# Patient Record
Sex: Male | Born: 1935 | Race: White | Hispanic: No | Marital: Married | Smoking: Former smoker
Health system: Southern US, Community
[De-identification: ages and names within clinical notes are randomized; demographics above are authoritative.]

## PROBLEM LIST (undated history)

## (undated) DIAGNOSIS — J449 Chronic obstructive pulmonary disease, unspecified: Secondary | ICD-10-CM

## (undated) DIAGNOSIS — J8410 Pulmonary fibrosis, unspecified: Secondary | ICD-10-CM

## (undated) DIAGNOSIS — I4891 Unspecified atrial fibrillation: Secondary | ICD-10-CM

## (undated) DIAGNOSIS — M109 Gout, unspecified: Secondary | ICD-10-CM

## (undated) DIAGNOSIS — I509 Heart failure, unspecified: Secondary | ICD-10-CM

## (undated) DIAGNOSIS — I219 Acute myocardial infarction, unspecified: Secondary | ICD-10-CM

## (undated) DIAGNOSIS — I10 Essential (primary) hypertension: Secondary | ICD-10-CM

## (undated) DIAGNOSIS — I272 Pulmonary hypertension, unspecified: Secondary | ICD-10-CM

## (undated) DIAGNOSIS — E785 Hyperlipidemia, unspecified: Secondary | ICD-10-CM

## (undated) DIAGNOSIS — I2510 Atherosclerotic heart disease of native coronary artery without angina pectoris: Secondary | ICD-10-CM

## (undated) DIAGNOSIS — N189 Chronic kidney disease, unspecified: Secondary | ICD-10-CM

## (undated) DIAGNOSIS — C4491 Basal cell carcinoma of skin, unspecified: Secondary | ICD-10-CM

## (undated) HISTORY — PX: REPLACEMENT TOTAL KNEE: SUR1224

## (undated) HISTORY — DX: Gout, unspecified: M10.9

## (undated) HISTORY — DX: Basal cell carcinoma of skin, unspecified: C44.91

## (undated) HISTORY — DX: Atherosclerotic heart disease of native coronary artery without angina pectoris: I25.10

## (undated) HISTORY — DX: Chronic kidney disease, unspecified: N18.9

## (undated) HISTORY — PX: HERNIA REPAIR: SHX51

## (undated) HISTORY — PX: CARDIOVERSION: SHX1299

## (undated) HISTORY — DX: Pulmonary hypertension, unspecified: I27.20

## (undated) HISTORY — DX: Chronic obstructive pulmonary disease, unspecified: J44.9

## (undated) HISTORY — PX: CAROTID ENDARTERECTOMY: SUR193

## (undated) HISTORY — PX: CARDIAC CATHETERIZATION: SHX172

## (undated) HISTORY — DX: Unspecified atrial fibrillation: I48.91

## (undated) HISTORY — PX: TOTAL HIP ARTHROPLASTY: SHX124

## (undated) HISTORY — DX: Acute myocardial infarction, unspecified: I21.9

## (undated) HISTORY — DX: Heart failure, unspecified: I50.9

## (undated) HISTORY — DX: Essential (primary) hypertension: I10

## (undated) HISTORY — DX: Hyperlipidemia, unspecified: E78.5

## (undated) HISTORY — DX: Pulmonary fibrosis, unspecified: J84.10

---

## 2004-07-17 ENCOUNTER — Ambulatory Visit: Payer: Self-pay | Admitting: Gastroenterology

## 2008-09-19 ENCOUNTER — Inpatient Hospital Stay: Payer: Self-pay | Admitting: Internal Medicine

## 2008-10-24 ENCOUNTER — Inpatient Hospital Stay: Payer: Self-pay | Admitting: Cardiology

## 2008-12-15 ENCOUNTER — Emergency Department: Payer: Self-pay | Admitting: Emergency Medicine

## 2009-02-13 ENCOUNTER — Ambulatory Visit: Payer: Self-pay | Admitting: Vascular Surgery

## 2009-02-20 ENCOUNTER — Ambulatory Visit: Payer: Self-pay | Admitting: Cardiology

## 2009-02-20 ENCOUNTER — Ambulatory Visit: Payer: Self-pay | Admitting: Vascular Surgery

## 2009-02-26 ENCOUNTER — Inpatient Hospital Stay: Payer: Self-pay | Admitting: Vascular Surgery

## 2011-12-10 ENCOUNTER — Ambulatory Visit: Payer: Self-pay | Admitting: Internal Medicine

## 2011-12-24 ENCOUNTER — Ambulatory Visit: Payer: Self-pay | Admitting: Cardiology

## 2011-12-24 LAB — BASIC METABOLIC PANEL
Anion Gap: 9 (ref 7–16)
BUN: 26 mg/dL — ABNORMAL HIGH (ref 7–18)
Creatinine: 1.1 mg/dL (ref 0.60–1.30)
EGFR (African American): >60
Glucose: 91 mg/dL (ref 65–99)
Osmolality: 286 (ref 275–301)

## 2011-12-24 LAB — CBC WITH DIFFERENTIAL/PLATELET
Basophil #: 0 10*3/uL (ref 0.0–0.1)
Eosinophil #: 0.3 10*3/uL (ref 0.0–0.7)
HCT: 43.1 % (ref 40.0–52.0)
Lymphocyte %: 22.3 %
MCHC: 33.4 g/dL (ref 32.0–36.0)
MCV: 109 fL — ABNORMAL HIGH (ref 80–100)
Monocyte %: 13 %
Neutrophil #: 2.7 10*3/uL (ref 1.4–6.5)
Neutrophil %: 58.3 %
Platelet: 203 10*3/uL (ref 150–440)
RDW: 15.3 % — ABNORMAL HIGH (ref 11.5–14.5)
WBC: 4.7 10*3/uL (ref 3.8–10.6)

## 2011-12-24 LAB — PROTIME-INR: Prothrombin Time: 13.4 secs (ref 11.5–14.7)

## 2012-02-15 ENCOUNTER — Encounter: Payer: Self-pay | Admitting: Specialist

## 2012-03-04 ENCOUNTER — Encounter: Payer: Self-pay | Admitting: Specialist

## 2012-04-03 ENCOUNTER — Encounter: Payer: Self-pay | Admitting: Specialist

## 2012-05-04 ENCOUNTER — Encounter: Payer: Self-pay | Admitting: Specialist

## 2012-06-04 ENCOUNTER — Encounter: Payer: Self-pay | Admitting: Specialist

## 2012-12-25 ENCOUNTER — Inpatient Hospital Stay: Payer: Self-pay | Admitting: Internal Medicine

## 2012-12-25 LAB — PROTIME-INR
INR: 4.6
Prothrombin Time: 41.5 secs — ABNORMAL HIGH (ref 11.5–14.7)

## 2012-12-25 LAB — CBC
MCH: 36.3 pg — ABNORMAL HIGH (ref 26.0–34.0)
MCHC: 33.4 g/dL (ref 32.0–36.0)
MCV: 109 fL — ABNORMAL HIGH (ref 80–100)
RBC: 2.87 10*6/uL — ABNORMAL LOW (ref 4.40–5.90)
RDW: 15.5 % — ABNORMAL HIGH (ref 11.5–14.5)

## 2012-12-25 LAB — COMPREHENSIVE METABOLIC PANEL
Albumin: 3.2 g/dL — ABNORMAL LOW (ref 3.4–5.0)
Anion Gap: 4 — ABNORMAL LOW (ref 7–16)
BUN: 28 mg/dL — ABNORMAL HIGH (ref 7–18)
Bilirubin,Total: 0.5 mg/dL (ref 0.2–1.0)
Calcium, Total: 8.2 mg/dL — ABNORMAL LOW (ref 8.5–10.1)
Chloride: 108 mmol/L — ABNORMAL HIGH (ref 98–107)
Co2: 28 mmol/L (ref 21–32)
Creatinine: 1.01 mg/dL (ref 0.60–1.30)
EGFR (African American): >60
EGFR (Non-African Amer.): >60
Glucose: 109 mg/dL — ABNORMAL HIGH (ref 65–99)
Potassium: 4 mmol/L (ref 3.5–5.1)
SGOT(AST): 24 U/L (ref 15–37)
SGPT (ALT): 16 U/L (ref 12–78)
Sodium: 140 mmol/L (ref 136–145)

## 2012-12-25 LAB — URINALYSIS, COMPLETE
Bacteria: NONE SEEN
Bilirubin,UR: NEGATIVE
Blood: NEGATIVE
Glucose,UR: NEGATIVE mg/dL (ref 0–75)
Ketone: NEGATIVE
Nitrite: NEGATIVE
Ph: 6 (ref 4.5–8.0)
Protein: NEGATIVE
RBC,UR: 1 /HPF (ref 0–5)
Specific Gravity: 1.009 (ref 1.003–1.030)
WBC UR: <1 /HPF (ref 0–5)

## 2012-12-25 LAB — LIPASE, BLOOD: Lipase: 102 U/L (ref 73–393)

## 2012-12-25 LAB — APTT: Activated PTT: 65.9 secs — ABNORMAL HIGH (ref 23.6–35.9)

## 2012-12-25 LAB — HEMOGLOBIN: HGB: 9.5 g/dL — ABNORMAL LOW (ref 13.0–18.0)

## 2012-12-26 LAB — CBC WITH DIFFERENTIAL/PLATELET
Basophil #: 0.1 10*3/uL (ref 0.0–0.1)
Basophil %: 0.9 %
HCT: 26.2 % — ABNORMAL LOW (ref 40.0–52.0)
HGB: 8.6 g/dL — ABNORMAL LOW (ref 13.0–18.0)
Lymphocyte %: 17.8 %
MCHC: 32.7 g/dL (ref 32.0–36.0)
Monocyte %: 12.5 %
Neutrophil #: 4.2 10*3/uL (ref 1.4–6.5)
Neutrophil %: 67.2 %
Platelet: 241 10*3/uL (ref 150–440)
RDW: 15.7 % — ABNORMAL HIGH (ref 11.5–14.5)

## 2012-12-26 LAB — BASIC METABOLIC PANEL
Anion Gap: 6 — ABNORMAL LOW (ref 7–16)
BUN: 26 mg/dL — ABNORMAL HIGH (ref 7–18)
Calcium, Total: 7.8 mg/dL — ABNORMAL LOW (ref 8.5–10.1)
Chloride: 114 mmol/L — ABNORMAL HIGH (ref 98–107)
Co2: 24 mmol/L (ref 21–32)
EGFR (African American): >60
Osmolality: 292 (ref 275–301)
Potassium: 3.4 mmol/L — ABNORMAL LOW (ref 3.5–5.1)
Sodium: 144 mmol/L (ref 136–145)

## 2012-12-26 LAB — PROTIME-INR: Prothrombin Time: 35.5 secs — ABNORMAL HIGH (ref 11.5–14.7)

## 2012-12-27 LAB — CBC WITH DIFFERENTIAL/PLATELET
Basophil #: 0 10*3/uL (ref 0.0–0.1)
Basophil %: 0.6 %
Lymphocyte #: 1.3 10*3/uL (ref 1.0–3.6)
Lymphocyte %: 21.4 %
MCH: 38.5 pg — ABNORMAL HIGH (ref 26.0–34.0)
Neutrophil #: 3.8 10*3/uL (ref 1.4–6.5)
Platelet: 233 10*3/uL (ref 150–440)
RBC: 2.23 10*6/uL — ABNORMAL LOW (ref 4.40–5.90)
RDW: 15.9 % — ABNORMAL HIGH (ref 11.5–14.5)
WBC: 6.2 10*3/uL (ref 3.8–10.6)

## 2012-12-28 LAB — BASIC METABOLIC PANEL
Anion Gap: 3 — ABNORMAL LOW (ref 7–16)
Calcium, Total: 7.6 mg/dL — ABNORMAL LOW (ref 8.5–10.1)
Chloride: 116 mmol/L — ABNORMAL HIGH (ref 98–107)
EGFR (African American): >60
EGFR (Non-African Amer.): >60

## 2012-12-28 LAB — PROTIME-INR
INR: 1.5
Prothrombin Time: 18 secs — ABNORMAL HIGH (ref 11.5–14.7)

## 2012-12-28 LAB — MAGNESIUM: Magnesium: 2.1 mg/dL

## 2012-12-28 LAB — RETICULOCYTES
Absolute Retic Count: 0.146 10*6/uL
Reticulocyte: 6.6 % — ABNORMAL HIGH

## 2013-05-14 ENCOUNTER — Ambulatory Visit: Payer: Self-pay | Admitting: Cardiology

## 2013-07-23 ENCOUNTER — Ambulatory Visit: Payer: Self-pay | Admitting: Otolaryngology

## 2013-07-23 LAB — CBC WITH DIFFERENTIAL/PLATELET
Basophil #: 0.1 10*3/uL (ref 0.0–0.1)
Basophil %: 0.9 %
Eosinophil #: 0.2 10*3/uL (ref 0.0–0.7)
HCT: 40.1 % (ref 40.0–52.0)
HGB: 13.7 g/dL (ref 13.0–18.0)
MCHC: 34.2 g/dL (ref 32.0–36.0)
Monocyte #: 0.7 x10 3/mm (ref 0.2–1.0)
Neutrophil #: 4.4 10*3/uL (ref 1.4–6.5)
Neutrophil %: 68.7 %
Platelet: 193 10*3/uL (ref 150–440)
RDW: 16.8 % — ABNORMAL HIGH (ref 11.5–14.5)
WBC: 6.4 10*3/uL (ref 3.8–10.6)

## 2013-08-06 ENCOUNTER — Ambulatory Visit: Payer: Self-pay | Admitting: Otolaryngology

## 2013-08-07 LAB — PATHOLOGY REPORT

## 2013-09-05 ENCOUNTER — Ambulatory Visit: Payer: Self-pay | Admitting: Specialist

## 2013-11-27 ENCOUNTER — Encounter: Payer: Self-pay | Admitting: Internal Medicine

## 2013-12-02 ENCOUNTER — Encounter: Payer: Self-pay | Admitting: Internal Medicine

## 2014-01-02 ENCOUNTER — Encounter: Payer: Self-pay | Admitting: Internal Medicine

## 2014-05-13 ENCOUNTER — Inpatient Hospital Stay: Payer: Self-pay | Admitting: Internal Medicine

## 2014-05-13 LAB — BASIC METABOLIC PANEL
Anion Gap: 9 (ref 7–16)
BUN: 65 mg/dL — ABNORMAL HIGH (ref 7–18)
CHLORIDE: 108 mmol/L — AB (ref 98–107)
CREATININE: 2.47 mg/dL — AB (ref 0.60–1.30)
Calcium, Total: 8.1 mg/dL — ABNORMAL LOW (ref 8.5–10.1)
Co2: 23 mmol/L (ref 21–32)
EGFR (Non-African Amer.): 23 — ABNORMAL LOW
GFR CALC AF AMER: 27 — AB
Glucose: 123 mg/dL — ABNORMAL HIGH (ref 65–99)
Osmolality: 299 (ref 275–301)
POTASSIUM: 4.1 mmol/L (ref 3.5–5.1)
Sodium: 140 mmol/L (ref 136–145)

## 2014-05-13 LAB — TROPONIN I
TROPONIN-I: 0.87 ng/mL — AB
Troponin-I: 1.2 ng/mL — ABNORMAL HIGH
Troponin-I: 1 ng/mL — ABNORMAL HIGH

## 2014-05-13 LAB — CBC
HCT: 43.1 % (ref 40.0–52.0)
HGB: 14.2 g/dL (ref 13.0–18.0)
MCH: 37.2 pg — AB (ref 26.0–34.0)
MCHC: 33 g/dL (ref 32.0–36.0)
MCV: 113 fL — AB (ref 80–100)
PLATELETS: 248 10*3/uL (ref 150–440)
RBC: 3.83 10*6/uL — AB (ref 4.40–5.90)
RDW: 17.5 % — ABNORMAL HIGH (ref 11.5–14.5)
WBC: 8.6 10*3/uL (ref 3.8–10.6)

## 2014-05-13 LAB — CK-MB: CK-MB: 6.4 ng/mL — AB (ref 0.5–3.6)

## 2014-05-13 LAB — APTT: Activated PTT: 51.3 secs — ABNORMAL HIGH (ref 23.6–35.9)

## 2014-05-13 LAB — PROTIME-INR
INR: 3.3
Prothrombin Time: 32.5 secs — ABNORMAL HIGH (ref 11.5–14.7)

## 2014-05-13 LAB — PRO B NATRIURETIC PEPTIDE: B-Type Natriuretic Peptide: 20942 pg/mL — ABNORMAL HIGH (ref 0–450)

## 2014-05-14 LAB — CBC WITH DIFFERENTIAL/PLATELET
BASOS PCT: 1 %
Basophil #: 0.1 10*3/uL (ref 0.0–0.1)
Eosinophil #: 0 10*3/uL (ref 0.0–0.7)
Eosinophil %: 0.3 %
HCT: 38.8 % — ABNORMAL LOW (ref 40.0–52.0)
HGB: 12.7 g/dL — ABNORMAL LOW (ref 13.0–18.0)
Lymphocyte #: 1 10*3/uL (ref 1.0–3.6)
Lymphocyte %: 12.6 %
MCH: 36.8 pg — ABNORMAL HIGH (ref 26.0–34.0)
MCHC: 32.8 g/dL (ref 32.0–36.0)
MCV: 112 fL — AB (ref 80–100)
Monocyte #: 0.6 x10 3/mm (ref 0.2–1.0)
Monocyte %: 8 %
Neutrophil #: 6 10*3/uL (ref 1.4–6.5)
Neutrophil %: 78.1 %
Platelet: 200 10*3/uL (ref 150–440)
RBC: 3.46 10*6/uL — ABNORMAL LOW (ref 4.40–5.90)
RDW: 16.8 % — ABNORMAL HIGH (ref 11.5–14.5)
WBC: 7.7 10*3/uL (ref 3.8–10.6)

## 2014-05-14 LAB — PROTIME-INR
INR: 3.3
Prothrombin Time: 32.4 secs — ABNORMAL HIGH (ref 11.5–14.7)

## 2014-05-14 LAB — BASIC METABOLIC PANEL
Anion Gap: 11 (ref 7–16)
BUN: 66 mg/dL — ABNORMAL HIGH (ref 7–18)
CALCIUM: 7.9 mg/dL — AB (ref 8.5–10.1)
CHLORIDE: 111 mmol/L — AB (ref 98–107)
Co2: 21 mmol/L (ref 21–32)
Creatinine: 2.16 mg/dL — ABNORMAL HIGH (ref 0.60–1.30)
EGFR (African American): 32 — ABNORMAL LOW
EGFR (Non-African Amer.): 27 — ABNORMAL LOW
Glucose: 96 mg/dL (ref 65–99)
Osmolality: 304 (ref 275–301)
Potassium: 4.1 mmol/L (ref 3.5–5.1)
Sodium: 143 mmol/L (ref 136–145)

## 2014-05-14 LAB — LIPID PANEL
Cholesterol: 116 mg/dL (ref 0–200)
HDL: 37 mg/dL — AB (ref 40–60)
LDL CHOLESTEROL, CALC: 64 mg/dL (ref 0–100)
TRIGLYCERIDES: 73 mg/dL (ref 0–200)
VLDL Cholesterol, Calc: 15 mg/dL (ref 5–40)

## 2014-05-14 LAB — HEPARIN LEVEL (UNFRACTIONATED)
ANTI-XA(UNFRACTIONATED): 0.23 [IU]/mL — AB (ref 0.30–0.70)
ANTI-XA(UNFRACTIONATED): 0.33 [IU]/mL (ref 0.30–0.70)

## 2014-05-14 LAB — APTT: Activated PTT: 155.2 secs — ABNORMAL HIGH (ref 23.6–35.9)

## 2014-05-15 LAB — BASIC METABOLIC PANEL
Anion Gap: 8 (ref 7–16)
BUN: 39 mg/dL — ABNORMAL HIGH (ref 7–18)
CALCIUM: 7.9 mg/dL — AB (ref 8.5–10.1)
CO2: 23 mmol/L (ref 21–32)
Chloride: 114 mmol/L — ABNORMAL HIGH (ref 98–107)
Creatinine: 1.53 mg/dL — ABNORMAL HIGH (ref 0.60–1.30)
EGFR (African American): 48 — ABNORMAL LOW
EGFR (Non-African Amer.): 41 — ABNORMAL LOW
GLUCOSE: 100 mg/dL — AB (ref 65–99)
OSMOLALITY: 298 (ref 275–301)
Potassium: 3.7 mmol/L (ref 3.5–5.1)
Sodium: 145 mmol/L (ref 136–145)

## 2014-05-15 LAB — CBC WITH DIFFERENTIAL/PLATELET
BASOS ABS: 0.1 10*3/uL (ref 0.0–0.1)
BASOS PCT: 0.8 %
EOS ABS: 0.1 10*3/uL (ref 0.0–0.7)
EOS PCT: 0.8 %
HCT: 40.1 % (ref 40.0–52.0)
HGB: 13 g/dL (ref 13.0–18.0)
Lymphocyte #: 1.1 10*3/uL (ref 1.0–3.6)
Lymphocyte %: 14.7 %
MCH: 36.2 pg — AB (ref 26.0–34.0)
MCHC: 32.4 g/dL (ref 32.0–36.0)
MCV: 112 fL — ABNORMAL HIGH (ref 80–100)
MONOS PCT: 11.1 %
Monocyte #: 0.8 x10 3/mm (ref 0.2–1.0)
NEUTROS PCT: 72.6 %
Neutrophil #: 5.3 10*3/uL (ref 1.4–6.5)
PLATELETS: 221 10*3/uL (ref 150–440)
RBC: 3.59 10*6/uL — ABNORMAL LOW (ref 4.40–5.90)
RDW: 17.3 % — ABNORMAL HIGH (ref 11.5–14.5)
WBC: 7.3 10*3/uL (ref 3.8–10.6)

## 2014-05-15 LAB — PROTIME-INR
INR: 2
INR: 1.9
PROTHROMBIN TIME: 21.9 s — AB (ref 11.5–14.7)
PROTHROMBIN TIME: 21.6 s — AB (ref 11.5–14.7)

## 2014-05-15 LAB — HEPARIN LEVEL (UNFRACTIONATED)
ANTI-XA(UNFRACTIONATED): 0.56 [IU]/mL (ref 0.30–0.70)
Anti-Xa(Unfractionated): 0.19 IU/mL — ABNORMAL LOW (ref 0.30–0.70)

## 2014-05-16 LAB — BASIC METABOLIC PANEL
ANION GAP: 9 (ref 7–16)
BUN: 27 mg/dL — AB (ref 7–18)
CHLORIDE: 117 mmol/L — AB (ref 98–107)
Calcium, Total: 7.7 mg/dL — ABNORMAL LOW (ref 8.5–10.1)
Co2: 20 mmol/L — ABNORMAL LOW (ref 21–32)
Creatinine: 1.4 mg/dL — ABNORMAL HIGH (ref 0.60–1.30)
EGFR (African American): 53 — ABNORMAL LOW
EGFR (Non-African Amer.): 46 — ABNORMAL LOW
GLUCOSE: 120 mg/dL — AB (ref 65–99)
Osmolality: 297 (ref 275–301)
POTASSIUM: 3.3 mmol/L — AB (ref 3.5–5.1)
SODIUM: 146 mmol/L — AB (ref 136–145)

## 2014-05-16 LAB — PROTIME-INR
INR: 1.9
INR: 1.6
INR: 1.7
Prothrombin Time: 21 secs — ABNORMAL HIGH (ref 11.5–14.7)
Prothrombin Time: 18.9 secs — ABNORMAL HIGH (ref 11.5–14.7)
Prothrombin Time: 19.3 s — ABNORMAL HIGH

## 2014-05-16 LAB — CBC WITH DIFFERENTIAL/PLATELET
Basophil #: 0 x10 3/mm 3
Basophil %: 0.4 %
Eosinophil #: 0.1 x10 3/mm 3
Eosinophil %: 1.2 %
HCT: 39.8 % — ABNORMAL LOW
HGB: 12.8 g/dL — ABNORMAL LOW
Lymphocyte %: 14.8 %
Lymphs Abs: 1 x10 3/mm 3
MCH: 36.7 pg — ABNORMAL HIGH
MCHC: 32.2 g/dL
MCV: 114 fL — ABNORMAL HIGH
Monocyte #: 0.8 "x10 3/mm "
Monocyte %: 11.5 %
Neutrophil #: 5.1 x10 3/mm 3
Neutrophil %: 72.1 %
Platelet: 211 x10 3/mm 3
RBC: 3.5 x10 6/mm 3 — ABNORMAL LOW
RDW: 17.2 % — ABNORMAL HIGH
WBC: 7 x10 3/mm 3

## 2014-05-16 LAB — HEPARIN LEVEL (UNFRACTIONATED): Anti-Xa(Unfractionated): 0.44 [IU]/mL

## 2014-05-17 LAB — BASIC METABOLIC PANEL
Anion Gap: 12 (ref 7–16)
BUN: 27 mg/dL — AB (ref 7–18)
CO2: 22 mmol/L (ref 21–32)
Calcium, Total: 8.2 mg/dL — ABNORMAL LOW (ref 8.5–10.1)
Chloride: 114 mmol/L — ABNORMAL HIGH (ref 98–107)
Creatinine: 1.48 mg/dL — ABNORMAL HIGH (ref 0.60–1.30)
EGFR (Non-African Amer.): 43 — ABNORMAL LOW
GFR CALC AF AMER: 50 — AB
Glucose: 116 mg/dL — ABNORMAL HIGH (ref 65–99)
Osmolality: 300 (ref 275–301)
Potassium: 3.8 mmol/L (ref 3.5–5.1)
SODIUM: 148 mmol/L — AB (ref 136–145)

## 2014-05-17 LAB — CBC WITH DIFFERENTIAL/PLATELET
BASOS ABS: 0 10*3/uL (ref 0.0–0.1)
Basophil %: 0.4 %
Eosinophil #: 0.1 10*3/uL (ref 0.0–0.7)
Eosinophil %: 1.1 %
HCT: 40.1 % (ref 40.0–52.0)
HGB: 12.9 g/dL — ABNORMAL LOW (ref 13.0–18.0)
LYMPHS ABS: 1 10*3/uL (ref 1.0–3.6)
Lymphocyte %: 12.5 %
MCH: 36.3 pg — AB (ref 26.0–34.0)
MCHC: 32.1 g/dL (ref 32.0–36.0)
MCV: 113 fL — ABNORMAL HIGH (ref 80–100)
Monocyte #: 0.8 x10 3/mm (ref 0.2–1.0)
Monocyte %: 10.2 %
Neutrophil #: 5.9 10*3/uL (ref 1.4–6.5)
Neutrophil %: 75.8 %
PLATELETS: 216 10*3/uL (ref 150–440)
RBC: 3.54 10*6/uL — ABNORMAL LOW (ref 4.40–5.90)
RDW: 16.8 % — ABNORMAL HIGH (ref 11.5–14.5)
WBC: 7.8 10*3/uL (ref 3.8–10.6)

## 2014-05-17 LAB — PROTIME-INR
INR: 1.3
Prothrombin Time: 15.5 secs — ABNORMAL HIGH (ref 11.5–14.7)

## 2014-06-19 ENCOUNTER — Ambulatory Visit: Payer: Self-pay | Admitting: Specialist

## 2014-07-31 ENCOUNTER — Emergency Department: Payer: Self-pay | Admitting: Emergency Medicine

## 2014-07-31 LAB — CBC WITH DIFFERENTIAL/PLATELET
BASOS ABS: 0 10*3/uL (ref 0.0–0.1)
Basophil %: 0.8 %
EOS PCT: 3.1 %
Eosinophil #: 0.2 10*3/uL (ref 0.0–0.7)
HCT: 41 % (ref 40.0–52.0)
HGB: 13.1 g/dL (ref 13.0–18.0)
LYMPHS ABS: 0.8 10*3/uL — AB (ref 1.0–3.6)
LYMPHS PCT: 15.8 %
MCH: 35.6 pg — AB (ref 26.0–34.0)
MCHC: 31.9 g/dL — ABNORMAL LOW (ref 32.0–36.0)
MCV: 112 fL — ABNORMAL HIGH (ref 80–100)
MONOS PCT: 12.9 %
Monocyte #: 0.7 x10 3/mm (ref 0.2–1.0)
Neutrophil #: 3.4 10*3/uL (ref 1.4–6.5)
Neutrophil %: 67.4 %
Platelet: 182 10*3/uL (ref 150–440)
RBC: 3.66 10*6/uL — AB (ref 4.40–5.90)
RDW: 15.9 % — ABNORMAL HIGH (ref 11.5–14.5)
WBC: 5.1 10*3/uL (ref 3.8–10.6)

## 2014-07-31 LAB — COMPREHENSIVE METABOLIC PANEL
ALK PHOS: 201 U/L — AB
ALT: 23 U/L
Albumin: 3.2 g/dL — ABNORMAL LOW (ref 3.4–5.0)
Anion Gap: 9 (ref 7–16)
BUN: 45 mg/dL — ABNORMAL HIGH (ref 7–18)
Bilirubin,Total: 0.8 mg/dL (ref 0.2–1.0)
CREATININE: 1.72 mg/dL — AB (ref 0.60–1.30)
Calcium, Total: 8.5 mg/dL (ref 8.5–10.1)
Chloride: 109 mmol/L — ABNORMAL HIGH (ref 98–107)
Co2: 23 mmol/L (ref 21–32)
GFR CALC AF AMER: 49 — AB
GFR CALC NON AF AMER: 41 — AB
Glucose: 108 mg/dL — ABNORMAL HIGH (ref 65–99)
Osmolality: 293 (ref 275–301)
POTASSIUM: 3.8 mmol/L (ref 3.5–5.1)
SGOT(AST): 27 U/L (ref 15–37)
Sodium: 141 mmol/L (ref 136–145)
Total Protein: 6.6 g/dL (ref 6.4–8.2)

## 2014-07-31 LAB — PROTIME-INR
INR: 5.2
Prothrombin Time: 46 secs — ABNORMAL HIGH (ref 11.5–14.7)

## 2014-11-02 ENCOUNTER — Inpatient Hospital Stay: Payer: Self-pay | Admitting: Internal Medicine

## 2014-11-02 LAB — BASIC METABOLIC PANEL
ANION GAP: 6 — AB (ref 7–16)
BUN: 55 mg/dL — AB (ref 7–18)
CREATININE: 1.79 mg/dL — AB (ref 0.60–1.30)
Calcium, Total: 8.6 mg/dL (ref 8.5–10.1)
Chloride: 113 mmol/L — ABNORMAL HIGH (ref 98–107)
Co2: 23 mmol/L (ref 21–32)
EGFR (African American): 47 — ABNORMAL LOW
GFR CALC NON AF AMER: 39 — AB
GLUCOSE: 135 mg/dL — AB (ref 65–99)
Osmolality: 300 (ref 275–301)
POTASSIUM: 4.2 mmol/L (ref 3.5–5.1)
Sodium: 142 mmol/L (ref 136–145)

## 2014-11-02 LAB — HEPATIC FUNCTION PANEL A (ARMC)
ALBUMIN: 3.7 g/dL (ref 3.4–5.0)
Alkaline Phosphatase: 149 U/L — ABNORMAL HIGH (ref 46–116)
BILIRUBIN DIRECT: 0.5 mg/dL — AB (ref 0.0–0.2)
Bilirubin,Total: 2 mg/dL — ABNORMAL HIGH (ref 0.2–1.0)
SGOT(AST): 39 U/L — ABNORMAL HIGH (ref 15–37)
SGPT (ALT): 46 U/L (ref 14–63)
Total Protein: 6.9 g/dL (ref 6.4–8.2)

## 2014-11-02 LAB — CBC
HCT: 41.6 % (ref 40.0–52.0)
HGB: 13.1 g/dL (ref 13.0–18.0)
MCH: 34.7 pg — ABNORMAL HIGH (ref 26.0–34.0)
MCHC: 31.5 g/dL — AB (ref 32.0–36.0)
MCV: 110 fL — ABNORMAL HIGH (ref 80–100)
PLATELETS: 177 10*3/uL (ref 150–440)
RBC: 3.77 10*6/uL — ABNORMAL LOW (ref 4.40–5.90)
RDW: 17.7 % — ABNORMAL HIGH (ref 11.5–14.5)
WBC: 11.1 10*3/uL — AB (ref 3.8–10.6)

## 2014-11-02 LAB — TROPONIN I
TROPONIN-I: 0.03 ng/mL
Troponin-I: 0.03 ng/mL
Troponin-I: 0.04 ng/mL

## 2014-11-02 LAB — MAGNESIUM: Magnesium: 2.6 mg/dL — ABNORMAL HIGH

## 2014-11-02 LAB — PROTIME-INR
INR: 2.1
PROTHROMBIN TIME: 23.6 s — AB

## 2014-11-02 LAB — PRO B NATRIURETIC PEPTIDE: B-TYPE NATIURETIC PEPTID: 11593 pg/mL — AB (ref 0–450)

## 2014-11-03 LAB — COMPREHENSIVE METABOLIC PANEL
ALBUMIN: 2.8 g/dL — AB (ref 3.4–5.0)
AST: 32 U/L (ref 15–37)
Alkaline Phosphatase: 119 U/L — ABNORMAL HIGH (ref 46–116)
Anion Gap: 3 — ABNORMAL LOW (ref 7–16)
BILIRUBIN TOTAL: 1.7 mg/dL — AB (ref 0.2–1.0)
BUN: 52 mg/dL — AB (ref 7–18)
Calcium, Total: 7.8 mg/dL — ABNORMAL LOW (ref 8.5–10.1)
Chloride: 116 mmol/L — ABNORMAL HIGH (ref 98–107)
Co2: 22 mmol/L (ref 21–32)
Creatinine: 1.56 mg/dL — ABNORMAL HIGH (ref 0.60–1.30)
EGFR (African American): 55 — ABNORMAL LOW
EGFR (Non-African Amer.): 45 — ABNORMAL LOW
GLUCOSE: 104 mg/dL — AB (ref 65–99)
Osmolality: 296 (ref 275–301)
POTASSIUM: 3.9 mmol/L (ref 3.5–5.1)
SGPT (ALT): 36 U/L (ref 14–63)
Sodium: 141 mmol/L (ref 136–145)
TOTAL PROTEIN: 5.4 g/dL — AB (ref 6.4–8.2)

## 2014-11-03 LAB — LIPID PANEL
Cholesterol: 103 mg/dL (ref 0–200)
HDL Cholesterol: 48 mg/dL (ref 40–60)
LDL CHOLESTEROL, CALC: 41 mg/dL (ref 0–100)
Triglycerides: 69 mg/dL (ref 0–200)
VLDL Cholesterol, Calc: 14 mg/dL (ref 5–40)

## 2014-11-03 LAB — TSH: THYROID STIMULATING HORM: 1.57 u[IU]/mL

## 2014-11-03 LAB — PROTIME-INR
INR: 1.8
Prothrombin Time: 21.4 secs — ABNORMAL HIGH

## 2014-11-04 LAB — MAGNESIUM: Magnesium: 2.4 mg/dL

## 2014-11-04 LAB — BASIC METABOLIC PANEL
Anion Gap: 8 (ref 7–16)
BUN: 43 mg/dL — ABNORMAL HIGH (ref 7–18)
Calcium, Total: 8.3 mg/dL — ABNORMAL LOW (ref 8.5–10.1)
Chloride: 111 mmol/L — ABNORMAL HIGH (ref 98–107)
Co2: 24 mmol/L (ref 21–32)
Creatinine: 1.62 mg/dL — ABNORMAL HIGH (ref 0.60–1.30)
GFR CALC AF AMER: 53 — AB
GFR CALC NON AF AMER: 43 — AB
Glucose: 184 mg/dL — ABNORMAL HIGH (ref 65–99)
OSMOLALITY: 301 (ref 275–301)
Potassium: 4.1 mmol/L (ref 3.5–5.1)
SODIUM: 143 mmol/L (ref 136–145)

## 2014-11-04 LAB — CBC WITH DIFFERENTIAL/PLATELET
Basophil #: 0 x10 3/mm 3 (ref 0.0–0.1)
Basophil %: 0.1 %
Eosinophil #: 0 x10 3/mm 3 (ref 0.0–0.7)
Eosinophil %: 0.1 %
HCT: 36.7 % — ABNORMAL LOW (ref 40.0–52.0)
HGB: 11.7 g/dL — ABNORMAL LOW (ref 13.0–18.0)
Lymphocyte %: 5 %
Lymphs Abs: 0.3 x10 3/mm 3 — ABNORMAL LOW (ref 1.0–3.6)
MCH: 34.7 pg — ABNORMAL HIGH (ref 26.0–34.0)
MCHC: 31.8 g/dL — ABNORMAL LOW (ref 32.0–36.0)
MCV: 109 fL — ABNORMAL HIGH (ref 80–100)
Monocyte #: 0.1 x10 3/mm — ABNORMAL LOW (ref 0.2–1.0)
Monocyte %: 1.1 %
Neutrophil #: 5.9 x10 3/mm 3 (ref 1.4–6.5)
Neutrophil %: 93.7 %
Platelet: 153 x10 3/mm 3 (ref 150–440)
RBC: 3.36 x10 6/mm 3 — ABNORMAL LOW (ref 4.40–5.90)
RDW: 17.5 % — ABNORMAL HIGH (ref 11.5–14.5)
WBC: 6.3 x10 3/mm 3 (ref 3.8–10.6)

## 2014-11-04 LAB — PROTIME-INR
INR: 1.5
PROTHROMBIN TIME: 18.1 s — AB

## 2014-11-05 LAB — BASIC METABOLIC PANEL
ANION GAP: 10 (ref 7–16)
BUN: 54 mg/dL — AB (ref 7–18)
CHLORIDE: 107 mmol/L (ref 98–107)
CREATININE: 1.69 mg/dL — AB (ref 0.60–1.30)
Calcium, Total: 8.5 mg/dL (ref 8.5–10.1)
Co2: 23 mmol/L (ref 21–32)
EGFR (African American): 50 — ABNORMAL LOW
EGFR (Non-African Amer.): 41 — ABNORMAL LOW
Glucose: 125 mg/dL — ABNORMAL HIGH (ref 65–99)
OSMOLALITY: 296 (ref 275–301)
POTASSIUM: 4 mmol/L (ref 3.5–5.1)
Sodium: 140 mmol/L (ref 136–145)

## 2014-11-05 LAB — PROTIME-INR
INR: 1.4
Prothrombin Time: 17.4 secs — ABNORMAL HIGH

## 2014-11-06 LAB — CBC WITH DIFFERENTIAL/PLATELET
Basophil #: 0 10*3/uL (ref 0.0–0.1)
Basophil %: 0 %
EOS PCT: 0 %
Eosinophil #: 0 10*3/uL (ref 0.0–0.7)
HCT: 35.1 % — ABNORMAL LOW (ref 40.0–52.0)
HGB: 11.5 g/dL — ABNORMAL LOW (ref 13.0–18.0)
LYMPHS ABS: 0.9 10*3/uL — AB (ref 1.0–3.6)
Lymphocyte %: 7 %
MCH: 35.8 pg — ABNORMAL HIGH (ref 26.0–34.0)
MCHC: 32.8 g/dL (ref 32.0–36.0)
MCV: 109 fL — AB (ref 80–100)
MONOS PCT: 5.8 %
Monocyte #: 0.8 x10 3/mm (ref 0.2–1.0)
NEUTROS PCT: 87.2 %
Neutrophil #: 11.6 10*3/uL — ABNORMAL HIGH (ref 1.4–6.5)
Platelet: 163 10*3/uL (ref 150–440)
RBC: 3.22 10*6/uL — AB (ref 4.40–5.90)
RDW: 17.2 % — ABNORMAL HIGH (ref 11.5–14.5)
WBC: 13.4 10*3/uL — AB (ref 3.8–10.6)

## 2014-11-06 LAB — BASIC METABOLIC PANEL
ANION GAP: 11 (ref 7–16)
BUN: 62 mg/dL — ABNORMAL HIGH (ref 7–18)
CALCIUM: 8.2 mg/dL — AB (ref 8.5–10.1)
CO2: 24 mmol/L (ref 21–32)
Chloride: 107 mmol/L (ref 98–107)
Creatinine: 1.72 mg/dL — ABNORMAL HIGH (ref 0.60–1.30)
EGFR (African American): 49 — ABNORMAL LOW
EGFR (Non-African Amer.): 40 — ABNORMAL LOW
Glucose: 118 mg/dL — ABNORMAL HIGH (ref 65–99)
Osmolality: 302 (ref 275–301)
Potassium: 3.9 mmol/L (ref 3.5–5.1)
Sodium: 142 mmol/L (ref 136–145)

## 2014-11-06 LAB — PROTIME-INR
INR: 1.7
PROTHROMBIN TIME: 19.8 s — AB

## 2014-11-07 LAB — CULTURE, BLOOD (SINGLE)

## 2014-11-26 ENCOUNTER — Ambulatory Visit: Payer: Self-pay | Admitting: Family

## 2015-01-17 ENCOUNTER — Other Ambulatory Visit: Payer: Self-pay | Admitting: Specialist

## 2015-01-17 DIAGNOSIS — J439 Emphysema, unspecified: Secondary | ICD-10-CM

## 2015-01-17 DIAGNOSIS — J8410 Pulmonary fibrosis, unspecified: Principal | ICD-10-CM

## 2015-01-24 NOTE — H&P (Signed)
PATIENT NAME:  [name] [name], [name] [name] MR#:  [id number] DATE OF BIRTH:  [birth year]  DATE OF ADMISSION:  12/25/2012  PRIMARY CARE PHYSICIAN: Dr. [name]  HISTORY OF PRESENT ILLNESS: The patient is an 81 year old Caucasian male with past medical history significant for history of a-fib, history of gout, also history of arthritis who is on nonsteroidal anti-inflammatory medications as well as Coumadin at home, presents to the hospital with complaints of black stools.  According to the patient, he was doing well up until approximately 4 or 5 days ago when he started having black stools. He has regular consistency stool, however, it is black in color. He also feels somewhat weak and tired over the past few days. He is also short of breath but denies any significant chest pains, denies any lightheadedness or dizziness. However, according to patient's wife, he has somewhat looked pale. He has no abdominal pains. His last bowel movement was today in the morning. He presented to the hospital for further evaluation where he was noted to have normal vital signs with normal blood pressure readings; however, the patient's hemoglobin level was low as 10.4 as opposed to 14.4 in March 2013. Hospitalist services were contacted for admission.   PAST MEDICAL HISTORY: Significant for history of left CEA in May 2010, history of  a-fib, had a-fib with RVR in 2009, history of CHF diastolic chronic, gout, pharyngitis in December 2009, history of cardiac catheterization which revealed LVH as well as moderate-to-severe   pulmonary hypertension in March 2013, history of hypertension, hyperlipidemia, diverticular disease knee as well as hip osteoarthritis, history of diverticular disease.   PAST SURGICAL HISTORY: Nasal polyps, bilateral hernias, right hip replacement in 2004, ventral hernia surgery in 2004, left total knee replacement in 2005.   MEDICATIONS: According to medical records, the patient is on amlodipine 2.5 mg p.o. daily,  Crestor 10 mg p.o. daily, ibuprofen 800 mg  p.o. twice daily and warfarin 2.5 mg p.o. alternating with 5 mg daily.   ALLERGIES:  MONOPRIL AS WELL AS COLCHICINE.   FAMILY HISTORY: Significant for a brother who has coronary artery disease. His father died at the age of 98 of MI.   SOCIAL HISTORY: No current smoking; however, the patient used to smoke for 20 years up to 1-1/2 packs a day.   He quit smoking at the age of 77. No alcohol abuse. The patient owns [employer].  He is married and lives with his wife.   REVIEW OF SYSTEMS: Positive for black stools, some shortness of breath, feeling tired and weak over the past 4 to 5 days, having some sinus congestion and mucus from his nose. He has seen his primary care physician as well as ENT for at least 3 times, and he still has significant problems, not too much improvement. Knee arthritis as well as hip arthritis and pains. Otherwise: CONSTITUTIONAL:  Denies any fatigue, weakness, fevers, weight loss or gain.  EYES: In regards to eyes, denies any blurry vision, double vision, glaucoma, or cataracts.  ENT: Denies any tinnitus, allergies, epistaxis, sinus pain, dentures, difficulty swallowing. RESPIRATORY: Denies any cough, wheezes, asthma, or COPD. CARDIOVASCULAR: Denies any chest pains, orthopnea, arrhythmia, palpitations or syncope.  GASTROINTESTINAL: Denies any nausea, vomiting, diarrhea or constipation. GENITOURINARY: Denies dysuria, hematuria, frequency or incontinence. ENDOCRINOLOGY: Denies polydipsia, nocturia, thyroid problems, heat or cold intolerance or thirst. HEMATOLOGIC: Denies anemia, easy bruising, bleeding or swollen glands.  SKIN: Denies any acne, rashes, lesions or change in moles.  MUSCULOSKELETAL: Denies arthritis, cramps, swelling.  NEUROLOGIC: Denies numbness, epilepsy or tremor. PSYCHIATRIC: Denies anxiety, insomnia or depression.    PHYSICAL EXAMINATION: VITAL SIGNS: On arrival to the hospital, temperature is 97.7, pulse  60, respiratory rate was 18, blood pressure 146/64, saturation was 97% on room air.  GENERAL: A well-developed, well-nourished Caucasian male in no significant distress, sitting on the stretcher.  HEENT: His pupils are equal and reactive to light. Extraocular movements are intact. No icterus or conjunctivitis.  Has normal hearing.  No pharyngeal erythema. Mucosa is moist.  NECK: No masses, supple, nontender. Thyroid is not enlarged. No adenopathy. No JVD or carotid bruits bilaterally.  Full range of motion.  LUNGS: Clear to auscultation in all fields. Dullness at bases but no rales, rhonchi or wheezing. No labored inspirations, increased effort, dullness to percussion or overt respiratory distress.  CARDIOVASCULAR: S1, S2 appreciated, irregularly irregular. PMI is not enlarged. Chest is tender to palpation, 1+ pedal pulses. No lower extremity edema, calf tenderness or cyanosis was noted.  ABDOMEN: Soft, nontender. Bowel sounds are present. No splenomegaly or masses were noted.  RECTAL: Deferred.  MUSCULOSKELETAL: Muscle strength: Able to move all extremities. No cyanosis, degenerative joint disease or kyphosis.  Gait not tested.  SKIN: Denies any rashes, lesions, erythema, nodularity or induration. It was warm and dry to palpation.  No adenopathy in the cervical region.  NEUROLOGICAL: Cranial nerves are grossly intact. Sensory is intact.  No dysarthria or aphasia. The patient is alert and oriented to time, person and place, cooperative. Memory is good.  PSYCHIATRIC: No significant confusion, agitation or depression noted.   LABORATORY, DIAGNOSTIC AND RADIOLOGICAL DATA:  BMP showed an elevated BUN to 28, glucose 109, otherwise BMP was unremarkable. Magnesium level was 2.7, lipase level 102. Liver enzymes: Albumin level was 2.3, otherwise unremarkable. The patient's white blood cell count was normal at 8.6, hemoglobin was 10.4 as compared to 14.4 in March 2013. The patient's platelet count was 301.  Urinalysis:  Straw-clear, negative for glucose, bilirubin or ketones, specific gravity 1.009, pH was 6.0, negative for blood, protein, nitrites or leukocyte esterase, 1 red blood cell, less than 1 white blood cell, no bacteria or epithelial cells were noted; positive mucus was present as well as 4 hyaline casts.   EKG is not done.  No radiologic studies were performed.   ASSESSMENT AND PLAN: 1. Gastrointestinal bleed in patient who is on nonsteroidal anti-inflammatory medications as well as Coumadin, concerning for upper gastrointestinal bleed:  Admit the patient to the medical floor. Start him on IV fluids as well as Protonix IV. Get a gastroenterologist involved for further recommendations. Hold ibuprofen as well as Coumadin.  2. Coagulopathy, acquired:  Hold the Coumadin and give the patient vitamin K.  Follow the patient's INR in the morning.  3. Acute posthemorrhagic anemia: Follow the patient's hemoglobin levels every 8 hours and transfuse as necessary.  4. Hyperglycemia:  Get hemoglobin A1c.  5. History of atrial fibrillation: We will be holding the patient's Coumadin and get EKG.  6. History of hypertension: Continue the patient on Norvasc.  7. Hyperlipidemia: Continue Crestor.      TIME SPENT: 50 minutes.   ____________________________ Theodoro Grist, MD rv:cb D: 12/25/2012 15:09:49 ET T: 12/25/2012 15:57:44 ET JOB#: [id number]  cc: Theodoro Grist, MD, <Dictator> Rusty Aus, MD [place] MD ELECTRONICALLY SIGNED 01/04/2013 15:13

## 2015-01-24 NOTE — Consult Note (Signed)
PATIENT NAME:  [name] [name], [name] [name] MR#:  [id number] DATE OF BIRTH:  [birth year]  DATE OF CONSULTATION:  12/26/2012  REFERRING PHYSICIAN:   CONSULTING PHYSICIAN:  Manya Silvas, MD  HISTORY OF PRESENT ILLNESS: The patient is an 81 year old white male, who came to the hospital because of melena for 4 or 5 days and symptoms suggesting anemia, including fatigue, shortness of breath, and the wife noted him to be pale. Hemoglobin a year ago was 14, on admission it was 10 and has fallen to the mid 8 range since admission. I was asked to see him for GI bleeding.   PAST MEDICAL HISTORY: Atrial fibrillation, history of diastolic CHF, gout, LVH, pulmonary hypertension, systemic hypertension, hyperlipidemia, history of diverticular disease.   PAST SURGICAL HISTORY: Right hip replacement, left total knee replacement, bilateral ventral hernia surgery.   MEDICATIONS ON ADMISSION:  1.  Ibuprofen 800 mg twice a day.  2.  Coumadin 2.5 mg alternating with 5 mg daily.  3.  Crestor 10 mg a day.  4.  Amlodipine 2.5 mg a day.   ALLERGIES: MONOPRIL AND COLCHICINE.   FAMILY HISTORY: Brother with coronary artery disease. Father died at 30 of an MI.   SOCIAL HISTORY: Does not smoke. Does not drink. Owns a [employer].   REVIEW OF SYSTEMS: No asthma or wheezing. No chest pains. He does have a tachyarrhythmia from atrial fib. No nausea or vomiting. No bright red blood per rectum. No pain with urination. No blood in his urine.   PHYSICAL EXAMINATION: GENERAL: Elderly, pleasant, cooperative white male in no acute distress.  HEENT: Sclerae nonicteric. Conjunctivae negative. Tongue negative.  NECK: No carotid bruits.  CHEST: Clear.  HEART: Tachycardia at about 118, some irregularity.  ABDOMEN: No hepatosplenomegaly. No masses. No bruits. No significant tenderness.  RECTAL: Not done.  SKIN: Warm and dry.  NEUROLOGIC: The patient is oriented, alert.   LABORATORY DATA: Albumin was 2.3,  hemoglobin 10.4 on  admission and this morning it was down to 8.6, 9.5 last night, platelet count 241, white count . Pro time is 35.5. INR is 3.7. He has A-positive blood with negative antibody screen. Urinalysis unremarkable. Portable chest x-ray shows mild left basilar opacities, possibly secondary to atelectasis. Recommend followup PA and lateral.   ASSESSMENT: Given his use of ibuprofen, his elevated pro time from Coumadin, his melena and fall in hemoglobin, he very likely has an upper gastrointestinal bleed, most likely gastric ulcer, possible duodenal ulcer; I doubt variceal bleed. He has been passing black stools for 4 or 5 days. This has probably been a continuous process.   RECOMMENDATIONS: I would prefer not to do upper endoscopy as long as his INR is above 1.6 and would like to see it come down. He has gotten vitamin K oral once and received vitamin K IV as well and I would, therefore, expect his pro time to improve. Will repeat his pro time tomorrow and if improved enough, will do an upper endoscopy tomorrow and then, if necessary, colonoscopy in the future.    ____________________________ Manya Silvas, MD rte:lg D: 12/26/2012 14:01:04 ET T: 12/26/2012 14:13:34 ET JOB#: [id number]  cc: Manya Silvas, MD, <Dictator> Rusty Aus, MD  Manya Silvas MD ELECTRONICALLY SIGNED 01/03/2013 12:40

## 2015-01-24 NOTE — Op Note (Signed)
PATIENT NAME:  [name] [name], [name] [name] MR#:  [id number] DATE OF BIRTH:  [birth year]  DATE OF PROCEDURE:  08/06/2013  PREOPERATIVE DIAGNOSIS: Chronic pansinusitis and nasal polyposis resistant to medical management.   POSTOPERATIVE DIAGNOSIS: Chronic pansinusitis and nasal polyposis resistant to medical management  PROCEDURE PERFORMED:  1.  Left frontal sinusotomy.  2.  Left total ethmoidectomy.  3.  Left maxillary antrostomy with tissue removal.  4.  Right maxillary antrostomy with tissue removal.  5.  Right anterior ethmoidectomy.  6.  Right sphenoidotomy with tissue removal.  7.  Image guided sinus surgery.   SURGEON: Carloyn Manner, M.D.   ANESTHESIA: General endotracheal anesthesia.   ESTIMATED BLOOD LOSS: 100 mL.   IV FLUIDS: Please see anesthesia record.   COMPLICATIONS: None.   DRAINS/STENT PLACEMENTS: Stammberger Sinu-Foam and XeroGel packing.   INDICATIONS FOR PROCEDURE: The patient is an 81 year old male with history of chronic sinusitis and nasal polyposis and chronic disease resistant to culture-directed antibiotics.   OPERATIVE FINDINGS: Significant purulent drainage in his maxillary, ethmoid and frontal sinuses bilaterally, significant polyposis from the right sphenoid sinus not appreciated on the preoperative CT scan, successful opening of the bilateral maxillary, right anterior ethmoid, left total ethmoid, left frontal and right sphenoid.   DESCRIPTION OF PROCEDURE: After the patient was identified in holding, the benefits and risks of the procedure were discussed and consent was reviewed, the patient was taken to the operating room and placed in the supine position. The Stryker image guided sinus system was set up and calibrated after the patient was intubated. This was an acceptable error and was referred to throughout the duration of the case for proper positioning and guidance. At this time, the patient was prepped and draped in a sterile fashion. Afrin-soaked pledgets  were placed in the patient's nasal cavity bilaterally. Under endoscopic visualization using the 0 degree endoscope, the patient's nasal cavities were evaluated. Attention was directed initially to the patient's left frontal sinus. The frontal sinus outflow tract had a significant amount of polyposis obstructing it. Using a pediatric 45 degree up-biting forceps, the nasal polyposis was debrided until the outflow tract was visualized and a balloon sinuplasty device was inserted and advanced for proper transillumination of the frontal sinus. This was in his frontal sinus and outflow tract was dilated with crusting of agger nasi cell. At this time, the remaining nasal polyposis was removed from the anterior and posterior aspect of the frontal sinus. Care was taken to avoid circumferential scarring. A significant amount of purulent drainage came from the left frontal sinus.   At this time, attention was directed to the patient's left maxillary sinus. Again, a significant amount of polyposis was present as well as purulent drainage in the middle meatus. Under endoscopic visualization, the uncinate process was outfractured. The Acclarent balloon device was inserted into the maxillary sinus. This was dilated and then the [facility] microdebrider and straight through cuts were used to remove polypoid tissue from the maxillary antrostomy to dilate a large maxillary antrostomy. Again, a significant amount of purulent debris was removed with suction device along the floor of the maxillary sinus. Attention, at this time, was directed to the left ethmoid. There was a significant amount nasal polyposis in the superior aspect of the anterior ethmoids. These were removed with Diego microdebrider. There was an osteitic component of an inferior ethmoid cell that actually was the posterior ethmoid as well. This was also opened due to purulent drainage emanating from it. At this time, Afrin-soaked pledgets were placed in  the patient's left  nasal cavity and attention was directed to the patient's right nasal cavity. In a similar fashion, the middle turbinate was infractured using a freer elevator. The Acclarent balloon device was attached to maxillary sinus. A dilation tip was placed at a 45 degrees angle and inserted into the maxillary os. The right maxillary sinus transilluminated. This was dilated and had a significant amount of purulent drainage that came out the dilated os. The uncinate process was outfractured from the maxillary sinus wall with a Diego microdebrider and straight through cut forceps were used to create a widely patent maxillary antrostomy.   Again, visualization with a 30 degree scope inside the maxillary sinus revealed a significant amount of purulent drainage, which was removed with rhinologic suction. The right anterior ethmoid bulla was opened using straight suction with the image guidance and the inferior and medial wall was removed with the Rosenberg for a widely patent anterior ethmoidectomy. Purulent drainage was also noted in the left anterior ethmoid sinus. At this time, nasal polyposis was noted medial to the middle turbinate. This was debulked and this came all the way down to the right sphenoid sinus with some purulent drainage. This was debulked with a straight biter and then the Acclarent balloon device was used to open up the face of the sphenoid sinus through the natural os and then purulent drainage was removed as well as nasal polyposis from the sphenoid sinus.   At this time, with completion of the right and left maxillary antrostomy, the right anterior ethmoid, the left total ethmoid, the left frontal and the right sphenoid, the vortex irrigation device was used in the maxillary sinuses and the left frontal sinus and the sphenoid sinus were copiously irrigated. Stammberger Sinu-Foam was placed along the cut edge of the maxillary antrostomy as well as ethmoidectomies bilaterally and then XeroGel  was placed and inflated with Afrin bilaterally for medialization of the middle turbinates. At this time, care of the patient was transferred to anesthesia, where the patient tolerated the procedure well and was taken to PACU in good condition.    ____________________________ Jerene Bears, MD ccv:aw D: 08/06/2013 09:43:24 ET T: 08/06/2013 10:24:47 ET JOB#: [id number]  cc: Jerene Bears, MD, <Dictator> Jerene Bears MD ELECTRONICALLY SIGNED 08/17/2013 7:18

## 2015-01-24 NOTE — Consult Note (Signed)
Pt CC melena.  He is doing better, hgb stable, PT down.  Plan to do EGD tomorrow early morning and if OK he can go home later in the day.  Chest clear, heart irreg irreg rate 108, abd not tender.  MCV elevated, maybe due to retics but will check for B 12 and Folate in am.  Electronic Signatures: Manya Silvas (MD)  (Signed on 26-Mar-14 09:25)  Authored  Last Updated: 26-Mar-14 09:25 by Manya Silvas (MD)

## 2015-01-24 NOTE — Consult Note (Signed)
Pt CC: melena and acute GI bleeding.  Pt EGD showed no ulcers, erosions or masses.  No source of bleeding seen. This can sometimes occur with anticoagulation due to small erosions which will heal up.  Can't rule out Dieulafoy ulcer in small bowel but these are rare.  OK to discharge home and follow up with Dr. Sabra Heck.  Electronic Signatures: Manya Silvas (MD)  (Signed on 27-Mar-14 14:55)  Authored  Last Updated: 27-Mar-14 14:55 by Manya Silvas (MD)

## 2015-01-24 NOTE — Discharge Summary (Signed)
PATIENT NAME:  [name] [name], [name] [name] MR#:  [id number] DATE OF BIRTH:  Feb 10, 1929  DATE OF ADMISSION:  12/25/2012 DATE OF DISCHARGE:  12/28/2012  DISCHARGE DIAGNOSES: 1. Upper gastrointestinal bleed with hypercoagulopathy.  2. Hypertension.  3. Significant iron deficiency anemia.  4. Chronic arthritis.  5. Atrial fibrillation, chronic.   DISCHARGE MEDICATIONS: Crestor 10 mg at bedtime, amlodipine 2.5 mg daily, ferrous gluconate 240 mg, iron tab daily, Protonix 40 mg b.i.d., tramadol 50 mg t.i.d. p.r.n. pain.   REASON FOR ADMISSION: This 81 year old male presents with upper GI bleed. Please see H and P for HPI, past medical history and physical exam.   HOSPITAL COURSE: The patient was admitted. Hemoglobin and dropped from 14 down to 10.6, ended up at 7.9 prior to discharge. No more melena was noted. His initial INR was 5.8.  With vitamin K, it came down to 1.5 prior to EGD. He had no chest pain, no abdominal pain. He had been taking quite a bit of anti-inflammatories as well as his INR initially was at 5.0. He went home for follow-up with the hemoglobin and with Dr. Sabra Heck on Monday. Any further lightheadedness or bleeding, he would call Dr. Sabra Heck immediately.  OVERALL PROGNOSIS: Guarded overall.  At this point, off Coumadin for now with a plan to restart Coumadin in a couple of weeks, if not sooner, depending on symptoms and hemoglobin.   ____________________________ Rusty Aus, MD mfm:cb D: 12/28/2012 17:59:34 ET T: 12/28/2012 21:26:44 ET JOB#: [id number]  cc: Rusty Aus, MD, <Dictator> Rusty Aus MD ELECTRONICALLY SIGNED 12/29/2012 8:06

## 2015-01-24 NOTE — Consult Note (Signed)
EGD scheduled for tomorrow afternoon.  Clear liq breakfast.  Can maybe go home afterwards if no dangerous findings.  Electronic Signatures: Manya Silvas (MD)  (Signed on 26-Mar-14 14:39)  Authored  Last Updated: 26-Mar-14 14:39 by Manya Silvas (MD)

## 2015-01-24 NOTE — Consult Note (Signed)
Chief Complaint:  Subjective/Chief Complaint patietn seen for Dr Vira Agar for melena.  stable.  some AF noted this am with higher rate.  now back on betapace/telemetry.  no n/v or abdominal pain.   VITAL SIGNS/ANCILLARY NOTES: **Vital Signs.:   27-Mar-14 13:45  Vital Signs Type Q 4hr  Temperature Temperature (F) 98.1  Celsius 36.7  Temperature Source oral  Pulse Pulse 107  Respirations Respirations 17  Systolic BP Systolic BP 867  Diastolic BP (mmHg) Diastolic BP (mmHg) 77  Mean BP 93  Pulse Ox % Pulse Ox % 91  Pulse Ox Activity Level  At rest  Oxygen Delivery Room Air/ 21 %   Brief Assessment:  Cardiac Irregular   Respiratory clear BS   Gastrointestinal details normal Soft  Nontender  Nondistended  No masses palpable  Bowel sounds normal   Lab Results: Routine Chem:  [id number] 26:71   Folic Acid, Serum 24.5 (Result(s) reported on 28 Dec 2012 at 05:34AM.)  Glucose, Serum 99  BUN 13  Creatinine (comp) 0.93  Sodium, Serum 143  Potassium, Serum 4.3  Chloride, Serum  116  CO2, Serum 24  Calcium (Total), Serum  7.6  Anion Gap  3  Osmolality (calc) 285  eGFR (African American) >60  eGFR (Non-African American) >60 (eGFR values <67m/min/1.73 m2 may be an indication of chronic kidney disease (CKD). Calculated eGFR is useful in patients with stable renal function. The eGFR calculation will not be reliable in acutely ill patients when serum creatinine is changing rapidly. It is not useful in  patients on dialysis. The eGFR calculation may not be applicable to patients at the low and high extremes of body sizes, pregnant women, and vegetarians.)  Magnesium, Serum 2.1 (1.8-2.4 THERAPEUTIC RANGE: 4-7 mg/dL TOXIC: > 10 mg/dL  -----------------------)  Routine Coag:  27-Mar-14 04:13   Prothrombin  18.0  INR 1.5 (INR reference interval applies to patients on anticoagulant therapy. A single INR therapeutic range for coumarins is not optimal for all indications; however, the  suggested range for most indications is 2.0 - 3.0. Exceptions to the INR Reference Range may include: Prosthetic heart valves, acute myocardial infarction, prevention of myocardial infarction, and combinations of aspirin and anticoagulant. The need for a higher or lower target INR must be assessed individually. Reference: The Pharmacology and Management of the Vitamin K  antagonists: the seventh ACCP Conference on Antithrombotic and Thrombolytic Therapy. CYKDXI.3382Sept:126 (3suppl): [id number] A HCT value >55% may artifactually increase the PT.  In one study,  the increase was an average of 25%. Reference:  "Effect on Routine and Special Coagulation Testing Values of Citrate Anticoagulant Adjustment in Patients with High HCT Values." American Journal of Clinical Pathology 2006;126:400-405.)  Routine Hem:  27-Mar-14 04:13   Retic Count  6.6 (0.4-3.1 NOTE: New reference range. 11/25/12)  Absolute Retic Count 0.146 (0.019-0.186 NOTE: New Reference Range 11/25/12)  Hemoglobin (CBC)  7.9 (Result(s) reported on 28 Dec 2012 at 05:48AM.)   Assessment/Plan:  Assessment/Plan:  Assessment 1) upper gi bleed/melena.   2) af-now back on rate control med.   Plan 1) egd today.  I have discussed the risks benefits and complications of egd to include not limited to bleeding infection perforation and sedation and he wishes to proceed.   Electronic Signatures: SLoistine Simas(MD)  (Signed 27-Mar-14 14:26)  Authored: Chief Complaint, VITAL SIGNS/ANCILLARY NOTES, Brief Assessment, Lab Results, Assessment/Plan   Last Updated: 27-Mar-14 14:26 by SLoistine Simas(MD)

## 2015-01-25 NOTE — Consult Note (Signed)
PATIENT NAME:  [name] [name], [name] [name] MR#:  [id number] DATE OF BIRTH:  Sep 13, 1929  DATE OF CONSULTATION:  05/13/2014  REFERRING PHYSICIAN:   CONSULTING PHYSICIAN:  Corey Skains, MD  CONSULTING PHYSICIAN:  Marisue Brooklyn, MD  REASON FOR CONSULTATION: Subendocardial myocardial infarction with atrial fibrillation.   CHIEF COMPLAINT: Chest pain.  HISTORY OF PRESENT ILLNESS: This is an 81 year old male with known atrial fibrillation, hypertension, hyperlipidemia, and has waxing, and waning of the substernal chest discomfort radiating into his back with shortness of breath, but no evidence of diaphoresis, over the last 3 days to the point where he could not breathe well at that time.   In the Emergency Room, he had EKG changes consistent with atrial fibrillation with ST changes, unclear whether this is atrial fibrillation, and/or consistent with myocardial infarction. Troponin level is 1.2. His atrial fibrillation has good heart rate control; blood pressure is controlled at this time. He has not had any further chest discomfort since admission.   REVIEW OF SYSTEMS: Negative for vision change, ringing in the ears, hearing loss, cough, congestion, heartburn, nausea, vomiting, diarrhea, bloody stools, stomach pain, extremity pain, leg weakness, cramping of the buttocks, known blood clots, headaches, blackouts, dizzy spells, nosebleeds, congestion, trouble swallowing, frequent urination, and night muscle weakness, numbness, anxiety, depression, skin lesions, or skin rashes.   PAST MEDICAL HISTORY: 1.  Atrial fibrillation.  2.  Hypertension.  3.  Hyperlipidemia.   FAMILY HISTORY: There is early onset of cardiovascular disease in brothers, as well as in parents.   SOCIAL HISTORY: Currently denies alcohol or tobacco use.   ALLERGIES: As listed.   MEDICATIONS: As listed.   PHYSICAL EXAMINATION: VITAL SIGNS: Blood pressure is 133/68, bilaterally; heart rate is 78 upright, reclining, and  irregular.  GENERAL: He is a well appearing male in no acute distress.  HEAD AND EYES AND EARS AND NOSE AND THROAT:  No icterus, thyromegaly, ulcers, hemorrhage, or xanthelasma.  CARDIOVASCULAR: Irregularly irregular, normal S1, and S2, a 2/6 apical murmur consistent with mitral regurgitation, PMI is diffuse, carotid upstroke normal without bruit, jugular venous pressure is normal.  LUNGS: Have bibasilar crackles and some decreased breath sounds.  ABDOMEN: Soft, nontender without hepatosplenomegaly or masses, abdominal aorta is normal size without bruit.  EXTREMITIES: Show 2+ bilateral pulses in dorsal, pedal, radial, and femoral arteries without lower extremity edema, cyanosis, clubbing, or ulcers.  NEUROLOGIC: He is oriented to time, place, and person, with normal mood, and affect.   ASSESSMENT: An 81 year old male with atrial fibrillation, hypertension, hyperlipidemia with acute subendocardial myocardial infarction.   RECOMMENDATIONS: 1.  Continue serial ECG and enzymes to assess for extent of myocardial infarction.  2.  Atrial fibrillation, heart rate control with beta blocker.  3.  Heparin for further risk reduction in stroke with atrial fibrillation and myocardial infarction.  4.  Treatment with lipids, and medication management for a goal LDL at 50% less if able.  5.  Proceed to cardiac catheterization to assess coronary anatomy, and further treatment thereof as necessary. The patient understands the risks and benefits of cardiac catheterization. These include the possibility of death, stroke, heart attack, infection, getting a blood clot; he is at low risk for conscious sedation.    ____________________________ Corey Skains, MD bjk:nt D: 05/13/2014 13:36:36 ET T: 05/13/2014 15:01:53 ET JOB#: [id number]  cc: Corey Skains, MD, <Dictator> Corey Skains MD ELECTRONICALLY SIGNED 05/15/2014 10:17

## 2015-01-25 NOTE — Consult Note (Signed)
PATIENT NAME:  [name] [name], ICKES MR#:  [id number] DATE OF BIRTH:  03-Oct-1929  DATE OF CONSULTATION:  05/16/2014  REFERRING PHYSICIAN:   CONSULTING PHYSICIAN:  Corey Skains, MD  CARDIAC NOTE:  The patient has had no evidence of significant worsening chest discomfort, shortness of breath, or weakness and fatigue with continued irregular heartbeat of atrial fibrillation with controlled ventricular rate. Review of systems shows no chest pain or other significant symptoms and the patient's physical examination is unchanged. The patient has had an elevated INR of 2 and  therefore, will not be able to have cardiac catheterization at this time, but has had an acute subendocardial myocardial infarction requiring further intervention. After INR is under 1.7 would proceed with cardiac catheterization to assess coronary anatomy and further treatment thereof if necessary. Currently would continue heart rate control of atrial fibrillation, hypertension, hyperlipidemia with continued use of heparin for further risk reduction and further myocardial infarction. He also will remain with oxygenation for his pulmonary fibrosis, stable at this time.   ____________________________ Corey Skains, MD bjk:JT D: 05/16/2014 06:30:44 ET T: 05/16/2014 06:47:58 ET JOB#: [id number]  cc: Corey Skains, MD, <Dictator> Corey Skains MD ELECTRONICALLY SIGNED 05/16/2014 12:48

## 2015-01-25 NOTE — Discharge Summary (Signed)
PATIENT NAME:  [name] [name], [name] [name] MR#:  [id number] DATE OF BIRTH:  Dec 26, 1928  DATE OF ADMISSION:  05/13/2014 DATE OF DISCHARGE:  05/17/2014  DISCHARGE DIAGNOSES: 1.  Non-Q-wave myocardial infarction.  2.  Chronic atrial fibrillation.  3.  Progressive pulmonary fibrosis with hypoxemia.  4.  Chronic renal insufficiency, chronic kidney disease III. 5.  Hypertension.  6.  Hyperlipidemia.   DISCHARGE MEDICATIONS: Allopurinol 300 mg daily, amlodipine 2.5 mg daily, Breo Ellipta 125 one puff b.i.d., ferrous fumarate 1 daily, Klor-Con 10 mEq daily, Protonix 40 mg b.i.d., probiotic formula daily, simvastatin 40 mg half tab at bedtime, sotalol 80 mg b.i.d., Spiriva 1 daily, torsemide 20 mg daily, Coumadin 5 mg at bedtime.   REASON FOR ADMISSION: This is an 81 year old male who presents with chest pain and progressive dyspnea. Please see H and P for HPI, past medical history, and physical exam.   HOSPITAL COURSE: The patient was admitted. His enzymes bumped slightly. Dr. Nehemiah Massed thought this was likely a very mild MI. With his renal insufficiency and pro time of 3, his Coumadin was held and he was hydrated. His renal function came down from 2.1 to 1.3 and he underwent heart catheterization showing a normal LVEF and three-vessel nonobstructive disease. It was apparent that coronary artery disease was really not the cause of his shortness of breath. He was IV diuresed. His oxygenation improved back to at least baseline and was thought that his dyspnea was really related to the pulmonary fibrosis. He will remain on his current medications, restart his Coumadin this evening. Overall prognosis is poor with his pulmonary fibrosis. He will follow up with Dr. Sabra Heck in 2 weeks.   ____________________________ Rusty Aus, MD mfm:sb D: 05/17/2014 08:21:31 ET T: 05/17/2014 11:48:01 ET JOB#: [id number]  cc: Rusty Aus, MD, <Dictator> Rusty Aus MD ELECTRONICALLY SIGNED 05/20/2014 8:07

## 2015-01-25 NOTE — H&P (Signed)
PATIENT NAME:  [name] [name], [name] [name] MR#:  [id number] DATE OF BIRTH:  09/11/1929  DATE OF ADMISSION:  05/13/2014  PRIMARY CARE PHYSICIAN: [name], MD  CARDIOLOGIST: Bartholome Bill, MD   CHIEF COMPLAINT: Shortness of breath.   HISTORY OF PRESENT ILLNESS: This is an 81 year old man who [place], had some shortness of breath episodes on Friday and Saturday lasting about 40 minutes each, then sort of resolved. Very weak, can hardly move around, had to hold onto things. The shortness of breath spells lasted about 40 minutes. Yesterday, he did feel okay but today very, very weak and also shortness of breath spell. Right now, currently feels okay. He came to the ER for further evaluation and was found to have an elevated troponin. The patient has no chest pain. Hospitalist services were contacted for further evaluation.   PAST MEDICAL HISTORY: Atrial fibrillation, hypertension, hyperlipidemia, pulmonary fibrosis, wears oxygen at night, lower extremity edema.   PAST SURGICAL HISTORY: Hip replacement, knee replacement, cyst on the abdomen, hernias bilaterally, nasal operation, left carotid endarterectomy.   ALLERGIES: No known drug allergies.   MEDICATIONS: Include allopurinol 300 mg daily, amlodipine 2.5 mg daily, Breo Ellipta  125 one puff twice a day, ferrous fumarate 1 tablet daily, Klor-Con 10 mEq daily, Protonix 40 mg twice a day, probiotic formula 1 capsule once a day, simvastatin 40 mg half tablet daily, sotalol 80 mg twice a day, Spiriva 1 inhalation daily, torsemide 20 mg daily.  FAMILY HISTORY: Father died at 44 of an MI. Brothers died at 86 and 43 of an MI.   SOCIAL HISTORY: Quit smoking 40 years ago. Drinks 1 with dinner. He used to work in the [employer].    REVIEW OF SYSTEMS: CONSTITUTIONAL: Positive for weakness. No fever, chills, or sweats. No weight loss. No weight gain.  EYES: He does wear glasses and has blurry vision.  EARS, NOSE, MOUTH AND  THROAT: Decreased hearing. Positive for postnasal drip. Positive for sore throat. No difficulty swallowing.  CARDIOVASCULAR: No chest pain. No palpitations.  RESPIRATORY: Positive for shortness of breath. Positive for coughing up green phlegm in the a.m. only. No hemoptysis.  GASTROINTESTINAL: No nausea. No vomiting. No abdominal pain. No diarrhea. No constipation. No bright red blood per rectum. No melena.  GENITOURINARY: No burning on urination or hematuria.  MUSCULOSKELETAL: Positive for joint pain.  INTEGUMENT: No rashes or eruptions.  NEUROLOGIC: No fainting or blackouts.  PSYCHIATRIC: No anxiety or depression.  ENDOCRINE: No thyroid problems.  HEMATOLOGIC AND LYMPHATIC: No anemia. No easy bruising or bleeding.   PHYSICAL EXAMINATION:  VITAL SIGNS: On presentation to the Emergency Room included a temperature 97.7, pulse 87, respirations 24, blood pressure 98/62, pulse oximetry 93% on room air.  GENERAL: No respiratory distress.  EYES: Conjunctivae and lids normal. Pupils equal, round, and reactive to light. Extraocular muscles intact. No nystagmus.  EARS, NOSE, MOUTH AND THROAT: Tympanic membranes: No erythema. Nasal mucosa: No erythema. Throat: No erythema, no exudate seen. Lips and gums: No lesions.  NECK: No JVD. No bruits. No lymphadenopathy. No thyromegaly. No thyroid nodules palpated.  RESPIRATORY: Decreased breath sounds bilaterally. Positive rhonchi, left base greater than right base.  CARDIOVASCULAR: S1, S2, irregularly irregular. No gallops, rubs, or murmurs heard. Carotid upstroke 2+ bilaterally. No bruits. Dorsalis pedal pulses 1+ bilaterally, 2+ edema bilateral lower extremity.  ABDOMEN: Soft, nontender. No organomegaly or splenomegaly. Normoactive bowel sounds. No masses felt.  LYMPHATIC: No lymph nodes in the neck.  MUSCULOSKELETAL:  No clubbing, edema, cyanosis.  SKIN: No ulcers or lesions.  NEUROLOGIC: Cranial nerves II-XII grossly intact.  PSYCHIATRIC: The patient is  oriented to person, place, and time.   LABORATORY AND RADIOLOGICAL DATA: Chest x-ray shows enlargement of the cardiac silhouette, COPD changes with minimal right basilar atelectasis, progression of chronic interstitial disease at the left base. White blood cell count 8.6, H and H 14.2 and 43.1, platelet count of 248,000. Troponin elevated at 1.2. Glucose 123, BUN 65, creatinine 2.47, sodium 140, potassium 4.1, chloride 108, CO2 of 23, calcium 8.1. BNP elevated at 20,942. INR 3.3.   EKG: Atrial fibrillation, 83 beats per minute, intraventricular conduction delay, poor R-wave progression with Q waves septally.   ASSESSMENT AND PLAN:  1.  Acute myocardial infarction. Dr. Nehemiah Massed already saw the patient and discussed cardiac catheterization, that will depend on the timing on when his kidney function improves a little bit and when his INR comes down. He will be placed on heparin drip. Blood pressure is a little too low for beta blocker at this point. He is on sotalol for atrial fibrillation. We will get an echocardiogram and continue aspirin daily.  2.  Pulmonary fibrosis. This does not seem to be a respiratory issue. We will continue his usual inhalers.  3.  Acute renal failure. We will give gentle IV fluid hydration for 1 liter and recheck creatinine in the a.m. Hold torsemide at this point. Hold the Norvasc with blood pressure being a little bit on the lower side  4.  History of hypertension. Blood pressure on the lower side, we will hold Norvasc at this time.  5.  Atrial fibrillation, rate controlled with sotalol. We will give a small dose of vitamin K to reverse her Coumadin for consideration for cardiac catheterization. Check an INR in the a.m.  6.  Hyperlipidemia: The patient is on Zocor, we will continue. We will check a lipid profile in the a.m.  7.  No signs of congestive heart failure on this at this time of admission.   TIME SPENT ON ADMISSION: Fifty-five minutes.   CODE STATUS: The patient  is a DNR.   ____________________________ Tana Conch. Leslye Peer, MD rjw:tm D: 05/13/2014 13:27:59 ET T: 05/13/2014 13:49:44 ET JOB#: [id number]  cc: Tana Conch. Leslye Peer, MD, <Dictator> Rusty Aus, MD Javier Docker. Ubaldo Glassing, MD Marisue Brooklyn MD ELECTRONICALLY SIGNED 05/14/2014 19:54

## 2015-01-25 NOTE — Consult Note (Signed)
PATIENT NAME:  [name] [name], [name] [name] MR#:  [id number] DATE OF BIRTH:  05-23-1929  DATE OF CONSULTATION:  05/15/2014  REFERRING PHYSICIAN:   CONSULTING PHYSICIAN:  Corey Skains, MD  CARDIAC NOTE:  The patient has had no further episodes of chest pain, but INR is elevated at this time to 1.9, and therefore with discussion, would not proceed to cardiac catheterization. He is in atrial fibrillation with controlled ventricular rate.  Hypertension and hyperlipidemia still very well controlled on current medical regimen, and the patient has had an acute myocardial infarction with minimal elevation of troponin with normal LV systolic function by echocardiogram and ejection fraction of 55%. The patient's review of systems is negative.  Physical examination is unchanged from last 24 hours.   PLAN: The patient now has need for cardiac catheterization and we will continue to follow for improvements of INR to perform cardiac catheterization in the next 24 hours.    ____________________________ Corey Skains, MD bjk:TT D: 05/16/2014 08:37:22 ET T: 05/16/2014 11:25:49 ET JOB#: [id number]  cc: Corey Skains, MD, <Dictator> Corey Skains MD ELECTRONICALLY SIGNED 05/16/2014 12:47

## 2015-01-31 DIAGNOSIS — I272 Pulmonary hypertension, unspecified: Secondary | ICD-10-CM

## 2015-01-31 DIAGNOSIS — I5032 Chronic diastolic (congestive) heart failure: Secondary | ICD-10-CM

## 2015-01-31 DIAGNOSIS — I4891 Unspecified atrial fibrillation: Secondary | ICD-10-CM | POA: Insufficient documentation

## 2015-01-31 DIAGNOSIS — I482 Chronic atrial fibrillation, unspecified: Secondary | ICD-10-CM

## 2015-02-02 NOTE — H&P (Signed)
PATIENT NAME:  [name] [name], [name] [name] MR#:  [id number] DATE OF BIRTH:  05/28/1929  DATE OF ADMISSION:  11/02/2014  PRIMARY CARE PHYSICIAN: Rusty Aus, MD   REFERRING PHYSICIAN: Latina Craver, MD  CHIEF COMPLAINT: Shortness of breath, cough with sputum for the past 3 days, leg swelling for 10 days.   HISTORY OF PRESENT ILLNESS: An 81 year old Caucasian male with a history of CHF, chronic respiratory failure on home oxygen, who presented to the ED with the above chief complaint. The patient is alert, awake, oriented, in no acute distress. The patient said he has had bad lower extremity swelling for the past 10 days. He gained weight for about 15 pounds. He started to have shortness of breath, cough with sputum for the past 2 days. In addition, the patient has generalized weakness. The patient is on home oxygen 1-2 liters during the day and 4 liters at night. The patient denies any fever or chills. No orthopnea or nocturnal dyspnea. The patient's oxygen saturation decreased to 84%, and he is being treated with oxygen 5 liters now. The patient was found to have CHF and is treated with Lasix.   PAST MEDICAL HISTORY:  1. Chronic CHF.  The patient's ejection fraction was 50%  to 55% last October.  2. Atrial fibrillation.  3. Hypertension.  4. Hyperlipidemia.  5. Pulmonary fibrosis.  6. Pulmonary hypertension.  7. Chronic respiratory failure on home oxygen.  8. NSTEMI. 9. CKD stage 3.  PAST SURGICAL HISTORY: Hip replacement, knee replacement, cyst on the abdomen, hernia repair, nasal operation, and left carotid endarterectomy.   SOCIAL HISTORY: No smoking or drinking or illicit drugs.   FAMILY HISTORY: Father died at 3 with MI and brother died at 22 of MI.  ALLERGIES: None.  HOME MEDICATIONS:  1. Treprostinil 0.6 mg/mL 15 inhaled 4 times a day.  2. Torsemide 20 mg p.o. b.i.d.  3. Tadalafil 40 mg once a day.  4. Zocor 40 mg 0.5 tablet once a day at bedtime.  5. Potassium chloride 20 mEq  1 tablet once a day. 6. Protonix 40 mg p.o. b.i.d.  7. Ferrous sulfate 324 mg p.o. daily.  8. Breo Ellipta  100 mcg/25 mcg inhalation powder 1 puff once a day.  9. Allopurinol 300 mg 0.5 tablet once a day.  10. Albuterol 90 mcg inhalation 2 puffs every 4 hours p.r.n.  11. The patient took Coumadin 5 mg due to elevated INR at 5.2, decreased to 1 mg daily.   REVIEW OF SYSTEMS: CONSTITUTIONAL: The patient denies any fever or chills. No headache or dizziness, but has generalized weakness.  EYES: No double vision, blurred vision.  EARS AND NOSE AND THROAT: No postnasal drip, slurred speech, or dysphagia.  CARDIOVASCULAR: No chest pain, palpitation, orthopnea, or nocturnal dyspnea, but has leg edema.  PULMONARY: Positive for cough, sputum, shortness of breath, and once hemoptysis. GASTROINTESTINAL: No abdominal pain, nausea, vomiting, diarrhea. No melena or bloody stool.  GENITOURINARY: No dysuria, hematuria, or incontinence.  SKIN: No rash or jaundice.  NEUROLOGIC: No syncope, loss of consciousness, or seizure.  ENDOCRINOLOGY: No polyuria, polydipsia, heat or cold intolerance.  HEMATOLOGY: No easy bruising or bleeding.   PHYSICAL EXAMINATION: VITAL SIGNS: Blood pressure 131/69, pulse 113, respirations 30, oxygen saturation 70% on oxygen by nasal cannula.  GENERAL: The patient is alert, awake, in no acute distress.  HEENT: Pupils round, equal, reactive to light and accommodation. Neck supple. No JVD or carotid bruit. No lymphadenopathy. No thyromegaly. Moist oral mucosa, clear pharynx.  CARDIOVASCULAR: S1 and S2. Irregular rate, tachycardia, no murmurs, rubs, or gallops.  PULMONARY: Bilateral air entry, bilateral rales. No wheezing. No use of accessory muscle to breathe.  ABDOMEN: Soft. No distention or tenderness. No organomegaly. Bowel sounds present, obese.  EXTREMITIES: Bilateral lower extremity edema, 2+, no clubbing or cyanosis. No calf tenderness. Bilateral pedal pulses are weak.   NEUROLOGY: A and O x 3. No focal deficit. Power 3 to 4/5. Sensation intact.   LABORATORY DATA: Glucose 135. BNP 11,593, BUN 55, creatinine 1.79, and sodium 142, potassium 4.2, chloride 113, bicarbonate 23, calcium 8.6. WBC 11.1, hemoglobin 13.1, platelets 177. INR 2.1. Chest x-ray: New bibasilar infiltrate or edema with possible small effusions. Troponin 0.03. EKG showed atrial fibrillation with RVR at 112 BPM.   IMPRESSIONS: 1. Acute on chronic diastolic congestive heart failure.  2. Acute on chronic respiratory failure due to hypoxia, secondary to acute on chronic congestive heart failure.  3. Atrial fibrillation with rapid ventricular response.  4. Acute renal failure on chronic kidney disease.  5. Pulmonary fibrosis.  6. Pulmonary hypertension.  7. Hypertension.  8. Hyperlipidemia,   PLAN OF TREATMENT:  1. The patient will be admitted to telemetry floor and continue telemetry monitor. Start CHF protocol. We will hold torsemide, but give Lasix 40 mg IV b.i.d. and closely monitor BMP.  2. For atrial fibrillation with rapid ventricular response, we will continue Coumadin dose per pharmacy and add Coreg and cardizem.  3. History of myocardial infarction and coronary artery disease. We will continue Zocor. Continue Coumadin, dose per pharmacy.  4. I discussed the patient's condition and plan of treatment with the patient and the patient's wife.   CODE STATUS: The patient wants full code.   TIME SPENT: About 62 minutes.    ____________________________ Demetrios Loll, MD qc:mw D: 11/02/2014 16:13:00 ET T: 11/02/2014 17:10:22 ET JOB#: [id number]  cc: Demetrios Loll, MD, <Dictator> Demetrios Loll MD ELECTRONICALLY SIGNED 11/02/2014 19:52

## 2015-02-02 NOTE — Discharge Summary (Signed)
PATIENT NAME:  [name] [name], [name] [name] MR#:  [id number] DATE OF BIRTH:  September 13, 1929  DATE OF ADMISSION:  11/02/2014 DATE OF DISCHARGE:  11/06/2014  DISCHARGE DIAGNOSES: 1. Acute on chronic diastolic congestive heart failure secondary to cor pulmonale.  2. Chronic atrial fibrillation.  3. Hypertension.  4. Hyperlipidemia.  5. Pulmonary fibrosis, severe.  6. Chronic respiratory failure on home oxygen.  7. Coronary artery disease.  8. Chronic kidney disease stage III.  9. Gout.  DISCHARGE MEDICATIONS: Treprostinil 0.6 mg/mL 15 mL inhaled 4 times a day. Torsemide 20 mg b.i.d. tadalafil 40 mg daily, simvastatin 40 mg 1/2 tab at bedtime, potassium chloride 20 mEq daily, Protonix 40 mg b.i.d., ferrous sulfate 324 mg daily, Breo Ellipta 100 mcg 1 puff daily, allopurinol 150 mg daily, albuterol 2 puffs q. 4 p.r.n., Coumadin 1 mg daily.   REASON FOR ADMISSION: An 81 year old male presents with respiratory failure. Please see H and P for HPI, past medical history, and physical exam.   HOSPITAL COURSE: The patient was admitted on high flow oxygen. His weight had been up 12 pounds. He was IV diuresed 6-7 liters over the course of hospitalization. His oxygenation came back to baseline. It was due to decompensated cor pulmonale, leading to his diastolic congestive heart failure. He is back to baseline and will continue managing his fluid status at home. Overall prognosis is guarded    ____________________________ Rusty Aus, MD mfm:mw D: 11/06/2014 07:31:26 ET T: 11/06/2014 11:05:51 ET JOB#: [id number]  cc: Rusty Aus, MD, <Dictator> Kveon Casanas Roselee Culver MD ELECTRONICALLY SIGNED 11/07/2014 8:16

## 2016-02-09 ENCOUNTER — Ambulatory Visit
Admission: RE | Admit: 2016-02-09 | Discharge: 2016-02-09 | Disposition: A | Discharge status: Home / Self Care | Payer: Medicare Other | Source: Ambulatory Visit | Attending: Internal Medicine | Admitting: Internal Medicine

## 2016-02-09 ENCOUNTER — Other Ambulatory Visit: Payer: Self-pay | Admitting: Internal Medicine

## 2016-02-09 DIAGNOSIS — R2241 Localized swelling, mass and lump, right lower limb: Secondary | ICD-10-CM | POA: Insufficient documentation

## 2016-02-09 DIAGNOSIS — R938 Abnormal findings on diagnostic imaging of other specified body structures: Secondary | ICD-10-CM | POA: Insufficient documentation

## 2016-05-15 ENCOUNTER — Emergency Department
Admission: EM | Admit: 2016-05-15 | Discharge: 2016-05-15 | Disposition: A | Discharge status: Home / Self Care | Payer: Medicare Other | Attending: Student in an Organized Health Care Education/Training Program | Admitting: Student in an Organized Health Care Education/Training Program

## 2016-05-15 ENCOUNTER — Encounter: Payer: Self-pay | Admitting: Emergency Medicine

## 2016-05-15 ENCOUNTER — Emergency Department: Payer: Medicare Other

## 2016-05-15 DIAGNOSIS — J449 Chronic obstructive pulmonary disease, unspecified: Secondary | ICD-10-CM | POA: Insufficient documentation

## 2016-05-15 DIAGNOSIS — M25411 Effusion, right shoulder: Secondary | ICD-10-CM | POA: Diagnosis not present

## 2016-05-15 DIAGNOSIS — I252 Old myocardial infarction: Secondary | ICD-10-CM | POA: Diagnosis not present

## 2016-05-15 DIAGNOSIS — Z79899 Other long term (current) drug therapy: Secondary | ICD-10-CM | POA: Insufficient documentation

## 2016-05-15 DIAGNOSIS — N189 Chronic kidney disease, unspecified: Secondary | ICD-10-CM | POA: Insufficient documentation

## 2016-05-15 DIAGNOSIS — I5032 Chronic diastolic (congestive) heart failure: Secondary | ICD-10-CM | POA: Insufficient documentation

## 2016-05-15 DIAGNOSIS — I2510 Atherosclerotic heart disease of native coronary artery without angina pectoris: Secondary | ICD-10-CM | POA: Insufficient documentation

## 2016-05-15 DIAGNOSIS — Z7901 Long term (current) use of anticoagulants: Secondary | ICD-10-CM | POA: Insufficient documentation

## 2016-05-15 DIAGNOSIS — I4891 Unspecified atrial fibrillation: Secondary | ICD-10-CM | POA: Insufficient documentation

## 2016-05-15 DIAGNOSIS — I130 Hypertensive heart and chronic kidney disease with heart failure and stage 1 through stage 4 chronic kidney disease, or unspecified chronic kidney disease: Secondary | ICD-10-CM | POA: Insufficient documentation

## 2016-05-15 DIAGNOSIS — Z87891 Personal history of nicotine dependence: Secondary | ICD-10-CM | POA: Diagnosis not present

## 2016-05-15 DIAGNOSIS — M25511 Pain in right shoulder: Secondary | ICD-10-CM | POA: Diagnosis present

## 2016-05-15 DIAGNOSIS — M75101 Unspecified rotator cuff tear or rupture of right shoulder, not specified as traumatic: Secondary | ICD-10-CM | POA: Diagnosis not present

## 2016-05-15 MED ORDER — OXYCODONE-ACETAMINOPHEN 5-325 MG PO TABS
1.0000 | ORAL_TABLET | Freq: Once | ORAL | Status: AC
  Administered 2016-05-15: 1 via ORAL
  Filled 2016-05-15: qty 1

## 2016-05-15 MED ORDER — PENTAFLUOROPROP-TETRAFLUOROETH EX AERO
INHALATION_SPRAY | Freq: Once | CUTANEOUS | Status: AC
  Administered 2016-05-15: 21:00:00 via TOPICAL
  Filled 2016-05-15: qty 30

## 2016-05-15 MED ORDER — OXYCODONE-ACETAMINOPHEN 5-325 MG PO TABS: 1.0000 | ORAL_TABLET | Freq: Four times a day (QID) | ORAL | 0 refills | Status: DC | PRN

## 2016-05-15 NOTE — ED Provider Notes (Signed)
[facility] Emergency Department Provider Note  ____________________________________________  Time seen: Approximately 7:30 PM  I have reviewed the triage vital signs and the nursing notes.   HISTORY  Chief Complaint Shoulder Pain    HPI [name] [name] is a 82 y.o. male who presents emergency department complaining of severe right shoulder pain. Patient has a known rotator cuff tear to the right shoulder. Patient sees Dr. Roland Rack at [facility] clinic for this complaint. Patient reports having multiple joint effusions to the right shoulder. He reports that his shoulder is both swollen and extremely painful at this time. Last joint aspiration occurred 6 days prior. Patient reports that swelling has returned and then increased every day since aspiration. No new injury.   Past Medical History:  Diagnosis Date  . Atrial fibrillation ([place])   . Basal cell carcinoma   . CHF (congestive heart failure) ([place])   . Chronic kidney disease   . COPD (chronic obstructive pulmonary disease) ([place])   . Coronary artery disease   . Gout   . Hyperlipidemia   . Hypertension   . Myocardial infarction ([place])   . Pulmonary fibrosis ([place])   . Pulmonary HTN [facility])     Patient Active Problem List   Diagnosis Date Noted  . Chronic diastolic heart failure ([place]) 01/31/2015  . Atrial fibrillation ([place]) 01/31/2015  . Pulmonary HTN ([place]) 01/31/2015    Past Surgical History:  Procedure Laterality Date  . CARDIAC CATHETERIZATION    . CARDIOVERSION    . CAROTID ENDARTERECTOMY Left   . HERNIA REPAIR    . REPLACEMENT TOTAL KNEE Left   . TOTAL HIP ARTHROPLASTY Right     Prior to Admission medications   Medication Sig Start Date End Date Taking? Authorizing Provider  albuterol (PROVENTIL HFA;VENTOLIN HFA) 108 (90 BASE) MCG/ACT inhaler Inhale 2 puffs into the lungs every 4 (four) hours as needed for wheezing or shortness of breath.   Yes Historical Provider, MD  allopurinol (ZYLOPRIM)  300 MG tablet Take 150 mg by mouth daily.   Yes Historical Provider, MD  ferrous sulfate 324 (65 FE) MG TBEC Take 1 tablet by mouth daily.   Yes Historical Provider, MD  Fluticasone Furoate-Vilanterol 100-25 MCG/INH AEPB Inhale 1 puff into the lungs daily.   Yes Historical Provider, MD  pantoprazole (PROTONIX) 40 MG tablet Take 40 mg by mouth daily.   Yes Historical Provider, MD  potassium chloride SA (K-DUR,KLOR-CON) 20 MEQ tablet Take 20 mEq by mouth 2 (two) times daily.   Yes Historical Provider, MD  simvastatin (ZOCOR) 40 MG tablet Take 20 mg by mouth daily.   Yes Historical Provider, MD  torsemide (DEMADEX) 20 MG tablet Take 20 mg by mouth 2 (two) times daily.   Yes Historical Provider, MD  Treprostinil (TYVASO) 0.6 MG/ML SOLN Inhale 18 mcg into the lungs 4 (four) times daily.   Yes Historical Provider, MD  warfarin (COUMADIN) 2 MG tablet Take 2 mg by mouth daily.   Yes Historical Provider, MD  oxyCODONE-acetaminophen (ROXICET) 5-325 MG tablet Take 1 tablet by mouth every 6 (six) hours as needed for severe pain. 05/15/16   Charline Bills Cuthriell, PA-C    Allergies Review of patient's allergies indicates no known allergies.  Family History  Problem Relation Age of Onset  . Heart attack Father   . Heart attack Brother     Social History Social History  Substance Use Topics  . Smoking status: Former Smoker    Packs/day: 1.00    Years:  20.00    Types: Cigarettes    Quit date: 01/31/1971  . Smokeless tobacco: Never Used  . Alcohol use 0.0 oz/week     Review of Systems  Constitutional: No fever/chills Cardiovascular: no chest pain. Respiratory: no cough. No SOB. Musculoskeletal: Positive for right shoulder pain Skin: Negative for rash, abrasions, lacerations, ecchymosis. Neurological: Negative for headaches, focal weakness or numbness. 10-point ROS otherwise negative.  ____________________________________________   PHYSICAL EXAM:  VITAL SIGNS: ED Triage Vitals  Enc Vitals  Group     BP 05/15/16 1853 127/79     Pulse Rate 05/15/16 1853 (!) 103     Resp 05/15/16 1853 20     Temp 05/15/16 1853 97.7 F (36.5 C)     Temp Source 05/15/16 1853 Oral     SpO2 05/15/16 1853 95 %     Weight 05/15/16 1855 152 lb (68.9 kg)     Height 05/15/16 1855 _0  (1.803 m)     Head Circumference --      Peak Flow --      Pain Score 05/15/16 1856 10     Pain Loc --      Pain Edu? --      Excl. in [place]? --      Constitutional: Alert and oriented. Well appearing and in no acute distress. Eyes: Conjunctivae are normal. PERRL. EOMI. Head: Atraumatic. Neck: No stridor.  No cervical spine tenderness to palpation.  Cardiovascular: Normal rate, regular rhythm. Normal S1 and S2.  Good peripheral circulation. Respiratory: Normal respiratory effort without tachypnea or retractions. Lungs CTAB. Good air entry to the bases with no decreased or absent breath sounds. Musculoskeletal: Limited range of motion to right shoulder but inspection. Obvious joint effusion is visualized. Area has positive ballottement. Area is tender to palpation. No osseous abnormality visualized or palpated. Sensation and pulses intact distally. Neurologic:  Normal speech and language. No gross focal neurologic deficits are appreciated.  Skin:  Skin is warm, dry and intact. No rash noted. Psychiatric: Mood and affect are normal. Speech and behavior are normal. Patient exhibits appropriate insight and judgement.   ____________________________________________   LABS (all labs ordered are listed, but only abnormal results are displayed)  Labs Reviewed - No data to display ____________________________________________  EKG   ____________________________________________  RADIOLOGY [facility] Cuthriell, personally viewed and evaluated these images (plain radiographs) as part of my medical decision making, as well as reviewing the written report by the radiologist.  Dg Shoulder 1 View Right  Result Date:  05/15/2016 CLINICAL DATA:  Right shoulder pain and swelling EXAM: RIGHT SHOULDER - 1 VIEW COMPARISON:  None. FINDINGS: Degenerative changes of the acromioclavicular joint and glenohumeral articulation are noted. The humeral head is somewhat high-riding consistent with the patient's given clinical history of rotator cuff tear. Lucency is noted in the lateral aspect of the proximal humerus likely chronic in nature although the possibility of underlying bony destruction from soft tissue changes could not be totally excluded. Considerable soft tissue swelling is noted laterally with a few small calcifications within. This may represent a subdeltoid bursitis. No other bony abnormality is noted. Changes of prior granulomatous disease are seen. IMPRESSION: Soft tissue swelling in the expected region of the subdeltoid bursa. This may represent focal bursitis. Lucency in the proximal humerus likely of a chronic nature. Electronically Signed   By: Inez Catalina M.D.   On: 05/15/2016 20:13    ____________________________________________    PROCEDURES  Procedure(s) performed:    .Joint Aspiration/Arthrocentesis Date/Time:  05/15/2016 7:41 PM Performed by: Betha Loa D Authorized by: Betha Loa D   Consent:    Consent obtained:  Verbal   Consent given by:  Patient   Risks discussed:  Bleeding, pain and incomplete drainage   Alternatives discussed:  No treatment, delayed treatment and referral Location:    Location:  Shoulder   Shoulder:  R acromioclavicular Anesthesia (see MAR for exact dosages):    Anesthesia method:  Topical application   Topical anesthesia: Gebauers. Procedure details:    Preparation: Patient was prepped and draped in usual sterile fashion     Needle gauge:  18 G   Ultrasound guidance: no     Approach:  Anterior   Aspirate amount:  186 mL   Aspirate characteristics:  Bloody   Steroid injected: no     Specimen collected: no   Post-procedure details:     Dressing:  Adhesive bandage   Patient tolerance of procedure:  Tolerated well, no immediate complications       Medications  oxyCODONE-acetaminophen (PERCOCET/ROXICET) 5-325 MG per tablet 1 tablet (1 tablet Oral Given 05/15/16 1949)  pentafluoroprop-tetrafluoroeth (GEBAUERS) aerosol ( Topical Given 05/15/16 2125)     ____________________________________________   INITIAL IMPRESSION / ASSESSMENT AND PLAN / ED COURSE  Pertinent labs & imaging results that were available during my care of the patient were reviewed by me and considered in my medical decision making (see chart for details).  Clinical Course    Patient's diagnosis is consistent with Right shoulder effusion. Patient has a known rotator cuff tear and is also on warfarin. It is likely that rotator cuff is bleeding into shoulder compartment. This is a recurring condition. Effusion is trained as described above. Patient tolerated well. Good symptomatic control as well as good reduction of effusion is obtained. Patient will be discharged home with prescriptions for pain medication and be taken as needed. Patient is to follow up with orthopedic provider in 2 days. Patient is given ED precautions to return to the ED for any worsening or new symptoms.     ____________________________________________  FINAL CLINICAL IMPRESSION(S) / ED DIAGNOSES  Final diagnoses:  Shoulder effusion, right  Rotator cuff tear, right      NEW MEDICATIONS STARTED DURING THIS VISIT:  Discharge Medication List as of 05/15/2016  9:15 PM    START taking these medications   Details  oxyCODONE-acetaminophen (ROXICET) 5-325 MG tablet Take 1 tablet by mouth every 6 (six) hours as needed for severe pain., Starting Sat 05/15/2016, Print            This chart was dictated using voice recognition software/Dragon. Despite best efforts to proofread, errors can occur which can change the meaning. Any change was purely unintentional.    Darletta Moll, PA-C 05/15/16 2238    Merlyn Lot, MD 05/16/16 [phone]

## 2016-05-15 NOTE — ED Triage Notes (Signed)
Patient had severe increase in R shoulder pain today with no new injury. Has history of torn rotater cuff without repair. R shoulder feels swollen at joint.

## 2016-07-10 ENCOUNTER — Encounter: Payer: Self-pay | Admitting: Emergency Medicine

## 2016-07-10 ENCOUNTER — Emergency Department
Admission: EM | Admit: 2016-07-10 | Discharge: 2016-07-10 | Disposition: A | Discharge status: Home / Self Care | Payer: Medicare Other | Attending: Student in an Organized Health Care Education/Training Program | Admitting: Student in an Organized Health Care Education/Training Program

## 2016-07-10 DIAGNOSIS — N189 Chronic kidney disease, unspecified: Secondary | ICD-10-CM | POA: Insufficient documentation

## 2016-07-10 DIAGNOSIS — J449 Chronic obstructive pulmonary disease, unspecified: Secondary | ICD-10-CM | POA: Diagnosis not present

## 2016-07-10 DIAGNOSIS — I130 Hypertensive heart and chronic kidney disease with heart failure and stage 1 through stage 4 chronic kidney disease, or unspecified chronic kidney disease: Secondary | ICD-10-CM | POA: Insufficient documentation

## 2016-07-10 DIAGNOSIS — R58 Hemorrhage, not elsewhere classified: Secondary | ICD-10-CM

## 2016-07-10 DIAGNOSIS — I5032 Chronic diastolic (congestive) heart failure: Secondary | ICD-10-CM | POA: Insufficient documentation

## 2016-07-10 DIAGNOSIS — Z79899 Other long term (current) drug therapy: Secondary | ICD-10-CM | POA: Insufficient documentation

## 2016-07-10 DIAGNOSIS — Z87891 Personal history of nicotine dependence: Secondary | ICD-10-CM | POA: Diagnosis not present

## 2016-07-10 DIAGNOSIS — Z4800 Encounter for change or removal of nonsurgical wound dressing: Secondary | ICD-10-CM | POA: Insufficient documentation

## 2016-07-10 DIAGNOSIS — Z7901 Long term (current) use of anticoagulants: Secondary | ICD-10-CM | POA: Insufficient documentation

## 2016-07-10 DIAGNOSIS — T45515A Adverse effect of anticoagulants, initial encounter: Secondary | ICD-10-CM

## 2016-07-10 DIAGNOSIS — Z5189 Encounter for other specified aftercare: Secondary | ICD-10-CM

## 2016-07-10 MED ORDER — LIDOCAINE-EPINEPHRINE-TETRACAINE (LET) SOLUTION
NASAL | Status: AC
  Filled 2016-07-10: qty 3

## 2016-07-10 MED ORDER — LIDOCAINE-EPINEPHRINE-TETRACAINE (LET) SOLUTION
3.0000 mL | Freq: Once | NASAL | Status: AC
  Administered 2016-07-10: 13:00:00 via TOPICAL

## 2016-07-10 NOTE — ED Notes (Signed)
MD at bedside. 

## 2016-07-10 NOTE — Discharge Instructions (Signed)
Please keep dressing clean and dry. Return for recurrent bleeding had not stopped after applying direct pressure. Return for any questions or concerns.

## 2016-07-10 NOTE — ED Triage Notes (Signed)
States hit rt leg approx 1 week ago. Wrapped his leg on mon. Went to change dressing today and states it wouldn't stop bleeding. On coumadin. Bleeding currently controlled

## 2016-07-10 NOTE — ED Provider Notes (Signed)
[facility] Emergency Department Provider Note    First MD Initiated Contact with Patient 07/10/16 1318     (approximate)  I have reviewed the triage vital signs and the nursing notes.   HISTORY  Chief Complaint Wound Check    HPI [name] [name] is a 82 y.o. male  on Coumadin for A. fib presents with persistent bleeding after his wife was doing a dressing change on a right shin skin tear. He sustained this skin tear roughly 1 week ago.  Patient was otherwise doing well. When change dressing) imaging to the bleeding to stop. States it is persistent is. She tried new skin as well as other topical wound dressing to stop the bleeding. Did not try compression. Dusek to persistent oozing they came to the ER for eval.   Past Medical History:  Diagnosis Date  . Atrial fibrillation ([place])   . Basal cell carcinoma   . CHF (congestive heart failure) ([place])   . Chronic kidney disease   . COPD (chronic obstructive pulmonary disease) ([place])   . Coronary artery disease   . Gout   . Hyperlipidemia   . Hypertension   . Myocardial infarction   . Pulmonary fibrosis ([place])   . Pulmonary HTN     Patient Active Problem List   Diagnosis Date Noted  . Chronic diastolic heart failure ([place]) 01/31/2015  . Atrial fibrillation ([place]) 01/31/2015  . Pulmonary HTN 01/31/2015    Past Surgical History:  Procedure Laterality Date  . CARDIAC CATHETERIZATION    . CARDIOVERSION    . CAROTID ENDARTERECTOMY Left   . HERNIA REPAIR    . REPLACEMENT TOTAL KNEE Left   . TOTAL HIP ARTHROPLASTY Right     Prior to Admission medications   Medication Sig Start Date End Date Taking? Authorizing Provider  albuterol (PROVENTIL HFA;VENTOLIN HFA) 108 (90 BASE) MCG/ACT inhaler Inhale 2 puffs into the lungs every 4 (four) hours as needed for wheezing or shortness of breath.    Historical Provider, MD  allopurinol (ZYLOPRIM) 300 MG tablet Take 150 mg by mouth daily.    Historical Provider, MD   ferrous sulfate 324 (65 FE) MG TBEC Take 1 tablet by mouth daily.    Historical Provider, MD  Fluticasone Furoate-Vilanterol 100-25 MCG/INH AEPB Inhale 1 puff into the lungs daily.    Historical Provider, MD  oxyCODONE-acetaminophen (ROXICET) 5-325 MG tablet Take 1 tablet by mouth every 6 (six) hours as needed for severe pain. 05/15/16   Charline Bills Cuthriell, PA-C  pantoprazole (PROTONIX) 40 MG tablet Take 40 mg by mouth daily.    Historical Provider, MD  potassium chloride SA (K-DUR,KLOR-CON) 20 MEQ tablet Take 20 mEq by mouth 2 (two) times daily.    Historical Provider, MD  simvastatin (ZOCOR) 40 MG tablet Take 20 mg by mouth daily.    Historical Provider, MD  torsemide (DEMADEX) 20 MG tablet Take 20 mg by mouth 2 (two) times daily.    Historical Provider, MD  Treprostinil (TYVASO) 0.6 MG/ML SOLN Inhale 18 mcg into the lungs 4 (four) times daily.    Historical Provider, MD  warfarin (COUMADIN) 2 MG tablet Take 2 mg by mouth daily.    Historical Provider, MD    Allergies Review of patient's allergies indicates no known allergies.  Family History  Problem Relation Age of Onset  . Heart attack Father   . Heart attack Brother     Social History Social History  Substance Use Topics  . Smoking status:  Former Smoker    Packs/day: 0.00    Years: 20.00    Quit date: 01/31/1971  . Smokeless tobacco: Never Used     Comment: quit 4 yrs ago  . Alcohol use 0.0 oz/week    Review of Systems Patient denies headaches, rhinorrhea, blurry vision, numbness, shortness of breath, chest pain, edema, cough, abdominal pain, nausea, vomiting, diarrhea, dysuria, fevers, rashes or hallucinations unless otherwise stated above in HPI. ____________________________________________   PHYSICAL EXAM:  VITAL SIGNS: Vitals:   07/10/16 1302  BP: 111/74  Pulse: 92  Resp: 16  Temp: 98.4 F (36.9 C)    Constitutional: Alert and oriented. Well appearing and in no acute distress. Eyes: Conjunctivae are  normal. PERRL. EOMI. Head: Atraumatic. Nose: No congestion/rhinnorhea. Mouth/Throat: Mucous membranes are moist.  Oropharynx non-erythematous. Neck: No stridor. Painless ROM. No cervical spine tenderness to palpation Hematological/Lymphatic/Immunilogical: No cervical lymphadenopathy. Cardiovascular: Normal rate, regular rhythm. Grossly normal heart sounds.  Good peripheral circulation. Respiratory: Normal respiratory effort.  No retractions. Lungs CTAB. Gastrointestinal: Soft and nontender. No distention. No abdominal bruits. No CVA tenderness. Genitourinary:  Musculoskeletal: No lower extremity tenderness nor edema.  No joint effusions. Neurologic:  Normal speech and language. No gross focal neurologic deficits are appreciated. No gait instability. Skin:  Skin is warm, dry and intact. No rash noted. The right lower extremity has 2 discrete areas of scab. No water in the mid shin is larger with evidence of pain persistent oozing. No underlying erythema or induration. No hematoma. No crepitus. Psychiatric: Mood and affect are normal. Speech and behavior are normal.  ____________________________________________   LABS (all labs ordered are listed, but only abnormal results are displayed)  No results found for this or any previous visit (from the past 24 hour(s)). ____________________________________________ ____________________________________________   PROCEDURES  Procedure(s) performed: none    Critical Care performed: no ____________________________________________   INITIAL IMPRESSION / ASSESSMENT AND PLAN / ED COURSE  Pertinent labs & imaging results that were available during my care of the patient were reviewed by me and considered in my medical decision making (see chart for details).  DDX: lac, abrasion, hematoma  [name] [name] is a 82 y.o. who presents to the ED with the area of persistent oozing of her skin tear. No evidence of surrounding infection. Wound care  provided at bedside. Patient without any signs or symptoms of acute blood loss anemia. Do not feel diagnostic imaging clinically indicated. Basic wound care provided at bedside and the patient remained hemostatic and he mechanically stable. Patient stable for outpatient follow-up.  Have discussed with the patient and available family all diagnostics and treatments performed thus far and all questions were answered to the best of my ability. The patient demonstrates understanding and agreement with plan.   Clinical Course     ____________________________________________   FINAL CLINICAL IMPRESSION(S) / ED DIAGNOSES  Final diagnoses:  Visit for wound check  Encounter for wound re-check  Bleeding on Coumadin      NEW MEDICATIONS STARTED DURING THIS VISIT:  New Prescriptions   No medications on file     Note:  This document was prepared using Dragon voice recognition software and may include unintentional dictation errors.    Merlyn Lot, MD 07/10/16 [phone]

## 2016-07-10 NOTE — ED Notes (Signed)
Pt resting in bed, wife at bedside, resp even and unlabored

## 2016-07-10 NOTE — ED Triage Notes (Signed)
Unable to get a sp02 reading - in nad. States uses oxygen at night to sleep

## 2016-07-24 DIAGNOSIS — I2510 Atherosclerotic heart disease of native coronary artery without angina pectoris: Secondary | ICD-10-CM | POA: Insufficient documentation

## 2016-07-24 DIAGNOSIS — M25511 Pain in right shoulder: Secondary | ICD-10-CM | POA: Diagnosis present

## 2016-07-24 DIAGNOSIS — M25011 Hemarthrosis, right shoulder: Secondary | ICD-10-CM | POA: Diagnosis not present

## 2016-07-24 DIAGNOSIS — Z87891 Personal history of nicotine dependence: Secondary | ICD-10-CM | POA: Insufficient documentation

## 2016-07-24 DIAGNOSIS — Z7901 Long term (current) use of anticoagulants: Secondary | ICD-10-CM | POA: Diagnosis not present

## 2016-07-24 DIAGNOSIS — I130 Hypertensive heart and chronic kidney disease with heart failure and stage 1 through stage 4 chronic kidney disease, or unspecified chronic kidney disease: Secondary | ICD-10-CM | POA: Diagnosis not present

## 2016-07-24 DIAGNOSIS — I252 Old myocardial infarction: Secondary | ICD-10-CM | POA: Insufficient documentation

## 2016-07-24 DIAGNOSIS — Z79899 Other long term (current) drug therapy: Secondary | ICD-10-CM | POA: Diagnosis not present

## 2016-07-24 DIAGNOSIS — J449 Chronic obstructive pulmonary disease, unspecified: Secondary | ICD-10-CM | POA: Diagnosis not present

## 2016-07-24 DIAGNOSIS — N189 Chronic kidney disease, unspecified: Secondary | ICD-10-CM | POA: Diagnosis not present

## 2016-07-24 DIAGNOSIS — I509 Heart failure, unspecified: Secondary | ICD-10-CM | POA: Diagnosis not present

## 2016-07-24 NOTE — ED Triage Notes (Signed)
Patient with swelling to right shoulder that started about 2 days ago. Patient states that the swelling become worse tonight. Patient states that he had this about 6 weeks ago and had to have fluid drained from it.

## 2016-07-25 ENCOUNTER — Emergency Department
Admission: EM | Admit: 2016-07-25 | Discharge: 2016-07-25 | Disposition: A | Discharge status: Home / Self Care | Payer: Medicare Other | Attending: Emergency Medicine | Admitting: Emergency Medicine

## 2016-07-25 ENCOUNTER — Encounter: Payer: Self-pay | Admitting: Emergency Medicine

## 2016-07-25 DIAGNOSIS — M25011 Hemarthrosis, right shoulder: Secondary | ICD-10-CM

## 2016-07-25 NOTE — ED Notes (Signed)
Reviewed d/c instructions and follow-up care with pt. Pt verbalized understanding

## 2016-07-25 NOTE — ED Provider Notes (Signed)
[facility] Emergency Department Provider Note  ____________________________________________   First MD Initiated Contact with Patient 07/25/16 [phone]     (approximate)  I have reviewed the triage vital signs and the nursing notes.   HISTORY  Chief Complaint Shoulder Pain    HPI [name] [name] is a 82 y.o. male with a history of right rotator cuff tear and recurrent hemarthrosis who takes warfarin for chronic atrial fibrillation.  He presents for evaluation of swelling on the right shoulder consistent with his prior hemarthrosis.  He reports that the pain is moderate and gradual in onset.  The shoulder started swelling earlier today and has gradually gotten worse and the pain developed from mild to moderate.  This has happened numerous times and he and his wife recall at least 4 times that a PA at the orthopedics clinic as drained it.  The last time it was drained was in this emergency department and Roderic [place] Cuthriell drained off nearly 200 mL of blood.  The patient does not remember any specific trauma to the shoulder but states that he can happen spontaneously or even if he lifts something too heavy.  He denies any other symptoms at this time including lightheadedness or dizziness. ROS is essentially negative as documented below.   Past Medical History:  Diagnosis Date  . Atrial fibrillation ([place])   . Basal cell carcinoma   . CHF (congestive heart failure) ([place])   . Chronic kidney disease   . COPD (chronic obstructive pulmonary disease) ([place])   . Coronary artery disease   . Gout   . Hyperlipidemia   . Hypertension   . Myocardial infarction   . Pulmonary fibrosis ([place])   . Pulmonary HTN     Patient Active Problem List   Diagnosis Date Noted  . Chronic diastolic heart failure ([place]) 01/31/2015  . Atrial fibrillation ([place]) 01/31/2015  . Pulmonary HTN 01/31/2015    Past Surgical History:  Procedure Laterality Date  . CARDIAC CATHETERIZATION      . CARDIOVERSION    . CAROTID ENDARTERECTOMY Left   . HERNIA REPAIR    . REPLACEMENT TOTAL KNEE Left   . TOTAL HIP ARTHROPLASTY Right     Prior to Admission medications   Medication Sig Start Date End Date Taking? Authorizing Provider  albuterol (PROVENTIL HFA;VENTOLIN HFA) 108 (90 BASE) MCG/ACT inhaler Inhale 2 puffs into the lungs every 4 (four) hours as needed for wheezing or shortness of breath.    Historical Provider, MD  allopurinol (ZYLOPRIM) 300 MG tablet Take 150 mg by mouth daily.    Historical Provider, MD  ferrous sulfate 324 (65 FE) MG TBEC Take 1 tablet by mouth daily.    Historical Provider, MD  Fluticasone Furoate-Vilanterol 100-25 MCG/INH AEPB Inhale 1 puff into the lungs daily.    Historical Provider, MD  oxyCODONE-acetaminophen (ROXICET) 5-325 MG tablet Take 1 tablet by mouth every 6 (six) hours as needed for severe pain. 05/15/16   Charline Bills Cuthriell, PA-C  pantoprazole (PROTONIX) 40 MG tablet Take 40 mg by mouth daily.    Historical Provider, MD  potassium chloride SA (K-DUR,KLOR-CON) 20 MEQ tablet Take 20 mEq by mouth 2 (two) times daily.    Historical Provider, MD  simvastatin (ZOCOR) 40 MG tablet Take 20 mg by mouth daily.    Historical Provider, MD  torsemide (DEMADEX) 20 MG tablet Take 20 mg by mouth 2 (two) times daily.    Historical Provider, MD  Treprostinil (TYVASO) 0.6 MG/ML SOLN Inhale 18  mcg into the lungs 4 (four) times daily.    Historical Provider, MD  warfarin (COUMADIN) 2 MG tablet Take 2 mg by mouth daily.    Historical Provider, MD    Allergies Review of patient's allergies indicates no known allergies.  Family History  Problem Relation Age of Onset  . Heart attack Father   . Heart attack Brother     Social History Social History  Substance Use Topics  . Smoking status: Former Smoker    Packs/day: 0.00    Years: 20.00    Quit date: 01/31/1971  . Smokeless tobacco: Never Used     Comment: quit 4 yrs ago  . Alcohol use 0.0 oz/week      Comment: occ    Review of Systems Constitutional: No fever/chills Eyes: No visual changes. ENT: No sore throat. Cardiovascular: Denies chest pain. Respiratory: Denies shortness of breath. Gastrointestinal: No abdominal pain.  No nausea, no vomiting.  No diarrhea.  No constipation. Genitourinary: Negative for dysuria. Musculoskeletal: Pain and swelling in right shoulder. Skin: Negative for rash. Neurological: Negative for headaches, focal weakness or numbness.  10-point ROS otherwise negative.  ____________________________________________   PHYSICAL EXAM:  VITAL SIGNS: ED Triage Vitals  Enc Vitals Group     BP 07/24/16 2359 118/66     Pulse Rate 07/24/16 2359 67     Resp 07/24/16 2359 18     Temp 07/24/16 2359 98 F (36.7 C)     Temp Source 07/24/16 2359 Oral     SpO2 07/24/16 2359 94 %     Weight 07/25/16 0000 151 lb (68.5 kg)     Height 07/25/16 0000 _0  (1.803 m)     Head Circumference --      Peak Flow --      Pain Score 07/25/16 0000 4     Pain Loc --      Pain Edu? --      Excl. in [place]? --     Constitutional: Alert and oriented. Well appearing and in no acute distress. Eyes: Conjunctivae are normal. PERRL. EOMI. Head: Atraumatic. Nose: No congestion/rhinnorhea. Mouth/Throat: Mucous membranes are moist.  Oropharynx non-erythematous. Neck: No stridor.  No meningeal signs.   Cardiovascular: Normal rate, regular rhythm. Good peripheral circulation. Grossly normal heart sounds. Respiratory: Normal respiratory effort.  No retractions. Lungs CTAB. Gastrointestinal: Soft and nontender. No distention.  Musculoskeletal: Large area of swelling and fluid collection at the right anterior shoulder.  There is no erythema and no significant warmth that would suggest cellulitis or an infected joint.  It is soft, nontender to palpation, and feels like a fluid collection. Neurologic:  Normal speech and language. No gross focal neurologic deficits are appreciated.  Skin:  Skin  is warm, dry and intact. No rash noted. Psychiatric: Mood and affect are normal. Speech and behavior are normal.  ____________________________________________   LABS (all labs ordered are listed, but only abnormal results are displayed)  Labs Reviewed - No data to display ____________________________________________  EKG  None - EKG not ordered by ED physician ____________________________________________  RADIOLOGY   No results found.  ____________________________________________   PROCEDURES  Procedure(s) performed:   .Joint Aspiration/Arthrocentesis Date/Time: 07/25/2016 5:03 AM Performed by: Hinda Kehr Authorized by: Hinda Kehr   Consent:    Consent obtained:  Verbal   Consent given by:  Patient and spouse   Risks discussed:  Bleeding, incomplete drainage, infection and pain   Alternatives discussed:  No treatment Location:    Location:  Shoulder  Shoulder:  R acromioclavicular Anesthesia (see MAR for exact dosages):    Anesthesia method:  None Procedure details:    Needle gauge:  20 G   Approach:  Superior   Aspirate amount:  100   Aspirate characteristics:  Bloody   Specimen collected: no   Post-procedure details:    Dressing: skin adhesive.   Patient tolerance of procedure:  Tolerated well, no immediate complications     Critical Care performed: No ____________________________________________   INITIAL IMPRESSION / ASSESSMENT AND PLAN / ED COURSE  Pertinent labs & imaging results that were available during my care of the patient were reviewed by me and considered in my medical decision making (see chart for details).  I questioned the patient about trauma and about this occurring in the past, but he and his wife are very certain that it has occurred at least 5 times previously and that he always feels much better after the fluid was drained off.  I verified in the prior medical record the note from [facility] where he reports that  he performed a joint aspiration with significant improvement of the patient's level of comfort.  After having a verbal discussion of the risks and benefits I performed an aspiration as described above, although it is not really of the joint so much as either the bursa or simply the soft tissue around the shoulder.  I withdrew approximately 100 mL of blood and the patient reports a significant improvement in his discomfort.  I do not want to cause any worsening damage or continue to expose the patient to infection so I stopped at that point.  He will follow up with orthopedics at the next available opportunity.  I gave him strict return precautions if he gets worse or if he develops any signs or symptoms of infection.   ____________________________________________  FINAL CLINICAL IMPRESSION(S) / ED DIAGNOSES  Final diagnoses:  Hemarthrosis of right shoulder     MEDICATIONS GIVEN DURING THIS VISIT:  Medications - No data to display   NEW OUTPATIENT MEDICATIONS STARTED DURING THIS VISIT:  Discharge Medication List as of 07/25/2016  5:12 AM      Discharge Medication List as of 07/25/2016  5:12 AM      Discharge Medication List as of 07/25/2016  5:12 AM       Note:  This document was prepared using Dragon voice recognition software and may include unintentional dictation errors.    Hinda Kehr, MD 07/25/16 [phone]

## 2016-07-25 NOTE — ED Notes (Signed)
Pt c/o pain/swelling to right shoulder. Pt has previous hx of bursitis to shoulder. Pt has had shoulder drained multiple times. Pt has hx of rotator cuff surgery to same shoulder

## 2016-07-25 NOTE — Discharge Instructions (Signed)
Tonight we aspirated approximately 100 mL of blood from your shoulder.  Given that this is a recurrent process, we strongly urged her to follow up with your orthopedics doctor at the next available opportunity to discuss the situation.  As we discussed, any time a needle is introduced into the body, there is a risk of infection, so if you develop any redness, worsening swelling, fever, or other symptoms that concern you, please return to the emergency department for further evaluation.

## 2016-08-03 ENCOUNTER — Other Ambulatory Visit: Payer: Self-pay | Admitting: Student

## 2016-08-03 DIAGNOSIS — M75101 Unspecified rotator cuff tear or rupture of right shoulder, not specified as traumatic: Principal | ICD-10-CM

## 2016-08-03 DIAGNOSIS — M12811 Other specific arthropathies, not elsewhere classified, right shoulder: Secondary | ICD-10-CM

## 2016-08-16 ENCOUNTER — Ambulatory Visit
Admission: RE | Admit: 2016-08-16 | Discharge: 2016-08-16 | Disposition: A | Discharge status: Home / Self Care | Payer: Medicare Other | Source: Ambulatory Visit | Attending: Student | Admitting: Student

## 2016-08-16 DIAGNOSIS — M12811 Other specific arthropathies, not elsewhere classified, right shoulder: Secondary | ICD-10-CM

## 2016-08-16 DIAGNOSIS — M19011 Primary osteoarthritis, right shoulder: Secondary | ICD-10-CM | POA: Diagnosis not present

## 2016-08-16 DIAGNOSIS — M25811 Other specified joint disorders, right shoulder: Secondary | ICD-10-CM | POA: Diagnosis not present

## 2016-08-16 DIAGNOSIS — M75121 Complete rotator cuff tear or rupture of right shoulder, not specified as traumatic: Secondary | ICD-10-CM | POA: Diagnosis not present

## 2016-08-16 DIAGNOSIS — M7551 Bursitis of right shoulder: Secondary | ICD-10-CM | POA: Diagnosis not present

## 2016-08-16 DIAGNOSIS — M75101 Unspecified rotator cuff tear or rupture of right shoulder, not specified as traumatic: Secondary | ICD-10-CM

## 2016-08-22 ENCOUNTER — Encounter: Payer: Self-pay | Admitting: Emergency Medicine

## 2016-08-22 ENCOUNTER — Emergency Department
Admission: EM | Admit: 2016-08-22 | Discharge: 2016-08-22 | Disposition: A | Discharge status: Home / Self Care | Payer: Medicare Other | Attending: Emergency Medicine | Admitting: Emergency Medicine

## 2016-08-22 DIAGNOSIS — Z7901 Long term (current) use of anticoagulants: Secondary | ICD-10-CM | POA: Insufficient documentation

## 2016-08-22 DIAGNOSIS — Z87891 Personal history of nicotine dependence: Secondary | ICD-10-CM | POA: Insufficient documentation

## 2016-08-22 DIAGNOSIS — I130 Hypertensive heart and chronic kidney disease with heart failure and stage 1 through stage 4 chronic kidney disease, or unspecified chronic kidney disease: Secondary | ICD-10-CM | POA: Diagnosis not present

## 2016-08-22 DIAGNOSIS — Z85828 Personal history of other malignant neoplasm of skin: Secondary | ICD-10-CM | POA: Diagnosis not present

## 2016-08-22 DIAGNOSIS — M25011 Hemarthrosis, right shoulder: Secondary | ICD-10-CM | POA: Diagnosis not present

## 2016-08-22 DIAGNOSIS — N189 Chronic kidney disease, unspecified: Secondary | ICD-10-CM | POA: Diagnosis not present

## 2016-08-22 DIAGNOSIS — I252 Old myocardial infarction: Secondary | ICD-10-CM | POA: Diagnosis not present

## 2016-08-22 DIAGNOSIS — J449 Chronic obstructive pulmonary disease, unspecified: Secondary | ICD-10-CM | POA: Insufficient documentation

## 2016-08-22 DIAGNOSIS — M25511 Pain in right shoulder: Secondary | ICD-10-CM | POA: Diagnosis present

## 2016-08-22 DIAGNOSIS — I5032 Chronic diastolic (congestive) heart failure: Secondary | ICD-10-CM | POA: Insufficient documentation

## 2016-08-22 DIAGNOSIS — I2510 Atherosclerotic heart disease of native coronary artery without angina pectoris: Secondary | ICD-10-CM | POA: Diagnosis not present

## 2016-08-22 DIAGNOSIS — Z79899 Other long term (current) drug therapy: Secondary | ICD-10-CM | POA: Diagnosis not present

## 2016-08-22 LAB — CBC WITH DIFFERENTIAL/PLATELET
BASOS ABS: 0.1 10*3/uL (ref 0–0.1)
BASOS PCT: 1 %
Eosinophils Absolute: 0.2 10*3/uL (ref 0–0.7)
Eosinophils Relative: 3 %
HEMATOCRIT: 33.1 % — AB (ref 40.0–52.0)
HEMOGLOBIN: 11.2 g/dL — AB (ref 13.0–18.0)
Lymphocytes Relative: 21 %
Lymphs Abs: 1.5 10*3/uL (ref 1.0–3.6)
MCH: 36.6 pg — ABNORMAL HIGH (ref 26.0–34.0)
MCHC: 34 g/dL (ref 32.0–36.0)
MCV: 107.9 fL — ABNORMAL HIGH (ref 80.0–100.0)
Monocytes Absolute: 0.6 10*3/uL (ref 0.2–1.0)
Monocytes Relative: 9 %
NEUTROS ABS: 4.8 10*3/uL (ref 1.4–6.5)
NEUTROS PCT: 66 %
Platelets: 185 10*3/uL (ref 150–440)
RBC: 3.06 MIL/uL — AB (ref 4.40–5.90)
RDW: 17.6 % — ABNORMAL HIGH (ref 11.5–14.5)
WBC: 7.2 10*3/uL (ref 3.8–10.6)

## 2016-08-22 LAB — PROTIME-INR
INR: 3.77
PROTHROMBIN TIME: 38.2 s — AB (ref 11.4–15.2)

## 2016-08-22 MED ORDER — OXYCODONE-ACETAMINOPHEN 5-325 MG PO TABS
1.0000 | ORAL_TABLET | Freq: Once | ORAL | Status: AC
  Administered 2016-08-22: 1 via ORAL
  Filled 2016-08-22: qty 1

## 2016-08-22 MED ORDER — OXYCODONE HCL 5 MG PO TABS: 5.0000 mg | ORAL_TABLET | Freq: Four times a day (QID) | ORAL | 0 refills | Status: DC | PRN

## 2016-08-22 NOTE — Discharge Instructions (Signed)
Call Dr. Ammie Ferrier office Monday morning for further instructions. Do not take your Coumadin tonight. Take oxycodone every 6 hours as needed for pain. Be aware that this medication could cause drowsiness and increase your risk for falling. Return to the emergency room if you began seeing blood in your urine or bowel movements.

## 2016-08-22 NOTE — ED Notes (Signed)
See triage note  States he woke up with bruising and swelling to right shoulder  Hx of same and has had area drained several times in past.

## 2016-08-22 NOTE — ED Provider Notes (Signed)
[facility] Emergency Department Provider Note   ____________________________________________   First MD Initiated Contact with Patient 08/22/16 0745     (approximate)  I have reviewed the triage vital signs and the nursing notes.   HISTORY  Chief Complaint Shoulder Pain    HPI [name] [name] is a 82 y.o. male is here with complaint of right shoulder pain. Patient states that he has had "fluid" drained off his shoulder at least 4 times in the past.Patient states that he works morning with bruising and swelling to his right shoulder. He is here today to have fluid drained off again. Patient states that the last time was in October. Patient states he currently takes his regular medication as directed by his doctor. He continues to take Coumadin and states that his level was checked less than 2 weeks ago. He denies any other problems other than his shoulder today. He denies any hematuria or gums bleeding. Currently he rates his pain is 9/10.   Past Medical History:  Diagnosis Date  . Atrial fibrillation ([place])   . Basal cell carcinoma   . CHF (congestive heart failure) ([place])   . Chronic kidney disease   . COPD (chronic obstructive pulmonary disease) ([place])   . Coronary artery disease   . Gout   . Hyperlipidemia   . Hypertension   . Myocardial infarction   . Pulmonary fibrosis ([place])   . Pulmonary HTN     Patient Active Problem List   Diagnosis Date Noted  . Chronic diastolic heart failure ([place]) 01/31/2015  . Atrial fibrillation ([place]) 01/31/2015  . Pulmonary HTN 01/31/2015    Past Surgical History:  Procedure Laterality Date  . CARDIAC CATHETERIZATION    . CARDIOVERSION    . CAROTID ENDARTERECTOMY Left   . HERNIA REPAIR    . REPLACEMENT TOTAL KNEE Left   . TOTAL HIP ARTHROPLASTY Right     Prior to Admission medications   Medication Sig Start Date End Date Taking? Authorizing Provider  albuterol (PROVENTIL HFA;VENTOLIN HFA) 108 (90 BASE)  MCG/ACT inhaler Inhale 2 puffs into the lungs every 4 (four) hours as needed for wheezing or shortness of breath.    Historical Provider, MD  allopurinol (ZYLOPRIM) 300 MG tablet Take 150 mg by mouth daily.    Historical Provider, MD  ferrous sulfate 324 (65 FE) MG TBEC Take 1 tablet by mouth daily.    Historical Provider, MD  Fluticasone Furoate-Vilanterol 100-25 MCG/INH AEPB Inhale 1 puff into the lungs daily.    Historical Provider, MD  oxyCODONE (ROXICODONE) 5 MG immediate release tablet Take 1 tablet (5 mg total) by mouth every 6 (six) hours as needed for moderate pain. 08/22/16 08/22/17  Johnn Hai, PA-C  oxyCODONE-acetaminophen (ROXICET) 5-325 MG tablet Take 1 tablet by mouth every 6 (six) hours as needed for severe pain. 05/15/16   Charline Bills Cuthriell, PA-C  pantoprazole (PROTONIX) 40 MG tablet Take 40 mg by mouth daily.    Historical Provider, MD  potassium chloride SA (K-DUR,KLOR-CON) 20 MEQ tablet Take 20 mEq by mouth 2 (two) times daily.    Historical Provider, MD  simvastatin (ZOCOR) 40 MG tablet Take 20 mg by mouth daily.    Historical Provider, MD  torsemide (DEMADEX) 20 MG tablet Take 20 mg by mouth 2 (two) times daily.    Historical Provider, MD  Treprostinil (TYVASO) 0.6 MG/ML SOLN Inhale 18 mcg into the lungs 4 (four) times daily.    Historical Provider, MD  warfarin (COUMADIN)  2 MG tablet Take 2 mg by mouth daily.    Historical Provider, MD    Allergies Patient has no known allergies.  Family History  Problem Relation Age of Onset  . Heart attack Father   . Heart attack Brother     Social History Social History  Substance Use Topics  . Smoking status: Former Smoker    Packs/day: 0.00    Years: 20.00    Quit date: 01/31/1971  . Smokeless tobacco: Never Used     Comment: quit 4 yrs ago  . Alcohol use 0.0 oz/week     Comment: occ    Review of Systems Constitutional: No fever/chills Eyes: No visual changes. Cardiovascular: Denies chest pain. Respiratory:  Denies shortness of breath. Gastrointestinal: No abdominal pain.  No nausea, no vomiting.   Genitourinary: Negative for hematuria. Musculoskeletal: Positive for right shoulder pain. Skin: Positive for ecchymosis right upper extremity. Neurological: Negative for headaches, focal weakness or numbness.  10-point ROS otherwise negative.  ____________________________________________   PHYSICAL EXAM:  VITAL SIGNS: ED Triage Vitals  Enc Vitals Group     BP 08/22/16 0735 120/68     Pulse Rate 08/22/16 0735 87     Resp 08/22/16 0735 18     Temp 08/22/16 0735 97.6 F (36.4 C)     Temp Source 08/22/16 0735 Oral     SpO2 08/22/16 0735 95 %     Weight 08/22/16 0736 155 lb (70.3 kg)     Height 08/22/16 0736 _0  (1.803 m)     Head Circumference --      Peak Flow --      Pain Score 08/22/16 0736 9     Pain Loc --      Pain Edu? --      Excl. in [place]? --     Constitutional: Alert and oriented. Well appearing and in no acute distress. Eyes: Conjunctivae are normal. PERRL. EOMI. Head: Atraumatic. Nose: No congestion/rhinnorhea. Neck: No stridor.   Cardiovascular: Normal rate, regular rhythm. Grossly normal heart sounds.  Good peripheral circulation. Respiratory: Normal respiratory effort.  No retractions. Lungs CTAB. Gastrointestinal: Soft and nontender. No distention. Musculoskeletal: Examination of the right shoulder there is no gross deformity however there is moderate soft tissue swelling present with moderate amount of ecchymosis right shoulder. Range of motion is restricted secondary to pain. There is no effusion present. There is ecchymosis noted diffusely over the forearm. Pulses present. Skin is warm and dry. Motor sensory function intact. Neurologic:  Normal speech and language. No gross focal neurologic deficits are appreciated.  Skin:  Skin is warm, dry and intact. Moderate ecchymosis right shoulder with some involvement to the forearm. Psychiatric: Mood and affect are normal.  Speech and behavior are normal.  ____________________________________________   LABS (all labs ordered are listed, but only abnormal results are displayed)  Labs Reviewed  PROTIME-INR - Abnormal; Notable for the following:       Result Value   Prothrombin Time 38.2 (*)    All other components within normal limits  CBC WITH DIFFERENTIAL/PLATELET - Abnormal; Notable for the following:    RBC 3.06 (*)    Hemoglobin 11.2 (*)    HCT 33.1 (*)    MCV 107.9 (*)    MCH 36.6 (*)    RDW 17.6 (*)    All other components within normal limits      PROCEDURES  Procedure(s) performed: None  Procedures  Critical Care performed: No  ____________________________________________   INITIAL IMPRESSION /  ASSESSMENT AND PLAN / ED COURSE  Pertinent labs & imaging results that were available during my care of the patient were reviewed by me and considered in my medical decision making (see chart for details).    Clinical Course   Discussed course and treatment plan with Dr. Dineen Kid who agrees that Coumadin needs to be held tonight and recheck INR. Patient and family was made aware that this is the reason why we will not draining any fluid off today is most likely with his INR being 3.7 that it will most likely feel up with blood once again. Patient was given Percocet in the emergency room for pain. He was also given a prescription for the same. Patient and family was made aware that this medication could cause drowsiness increase his risk for falling. He is return to the emergency room immediately if he sees any blood in his urine or bowel movements.   ____________________________________________   FINAL CLINICAL IMPRESSION(S) / ED DIAGNOSES  Final diagnoses:  Hemarthrosis, shoulder region, right      NEW MEDICATIONS STARTED DURING THIS VISIT:  Discharge Medication List as of 08/22/2016  8:59 AM    START taking these medications   Details  oxyCODONE (ROXICODONE) 5 MG immediate  release tablet Take 1 tablet (5 mg total) by mouth every 6 (six) hours as needed for moderate pain., Starting Sun 08/22/2016, Until Mon 08/22/2017, Print         Note:  This document was prepared using Dragon voice recognition software and may include unintentional dictation errors.    Johnn Hai, PA-C 08/22/16 1607    Orbie Pyo, MD 08/23/16 2036

## 2016-08-22 NOTE — ED Triage Notes (Signed)
Pt reports chronic right shoulder pain. Pt states that he often has to have fluid drained from the shoulder.

## 2016-10-06 DIAGNOSIS — I4891 Unspecified atrial fibrillation: Secondary | ICD-10-CM | POA: Diagnosis not present

## 2016-10-13 DIAGNOSIS — J40 Bronchitis, not specified as acute or chronic: Secondary | ICD-10-CM | POA: Diagnosis not present

## 2016-10-13 DIAGNOSIS — J9611 Chronic respiratory failure with hypoxia: Secondary | ICD-10-CM | POA: Diagnosis not present

## 2016-10-13 DIAGNOSIS — N184 Chronic kidney disease, stage 4 (severe): Secondary | ICD-10-CM | POA: Diagnosis not present

## 2016-10-13 DIAGNOSIS — I482 Chronic atrial fibrillation: Secondary | ICD-10-CM | POA: Diagnosis not present

## 2016-10-13 DIAGNOSIS — I5022 Chronic systolic (congestive) heart failure: Secondary | ICD-10-CM | POA: Diagnosis not present

## 2016-10-22 DIAGNOSIS — I270 Primary pulmonary hypertension: Secondary | ICD-10-CM | POA: Diagnosis not present

## 2017-01-03 ENCOUNTER — Encounter: Payer: Self-pay | Admitting: *Deleted

## 2017-01-03 ENCOUNTER — Emergency Department
Admission: EM | Admit: 2017-01-03 | Discharge: 2017-01-03 | Disposition: A | Discharge status: Home / Self Care | Payer: Medicare Other | Attending: Emergency Medicine | Admitting: Emergency Medicine

## 2017-01-03 DIAGNOSIS — N189 Chronic kidney disease, unspecified: Secondary | ICD-10-CM | POA: Diagnosis not present

## 2017-01-03 DIAGNOSIS — Z4800 Encounter for change or removal of nonsurgical wound dressing: Secondary | ICD-10-CM | POA: Diagnosis present

## 2017-01-03 DIAGNOSIS — J449 Chronic obstructive pulmonary disease, unspecified: Secondary | ICD-10-CM | POA: Diagnosis not present

## 2017-01-03 DIAGNOSIS — Z79899 Other long term (current) drug therapy: Secondary | ICD-10-CM | POA: Diagnosis not present

## 2017-01-03 DIAGNOSIS — I509 Heart failure, unspecified: Secondary | ICD-10-CM | POA: Insufficient documentation

## 2017-01-03 DIAGNOSIS — Z87891 Personal history of nicotine dependence: Secondary | ICD-10-CM | POA: Insufficient documentation

## 2017-01-03 DIAGNOSIS — I130 Hypertensive heart and chronic kidney disease with heart failure and stage 1 through stage 4 chronic kidney disease, or unspecified chronic kidney disease: Secondary | ICD-10-CM | POA: Insufficient documentation

## 2017-01-03 DIAGNOSIS — I2510 Atherosclerotic heart disease of native coronary artery without angina pectoris: Secondary | ICD-10-CM | POA: Insufficient documentation

## 2017-01-03 DIAGNOSIS — Z5189 Encounter for other specified aftercare: Secondary | ICD-10-CM

## 2017-01-03 NOTE — ED Triage Notes (Signed)
Arrives with wound on right hip area, wound has been there about 3 weeks, wife states she has been keeping it covered and applying neosporin, dime size area on left hip area, with yellow center, wife states pt sits a lot and does not walk much, awake and alert

## 2017-01-03 NOTE — ED Notes (Signed)
Pt reports "bed sore" to buttock.

## 2017-01-03 NOTE — ED Provider Notes (Signed)
[facility] Emergency Department Provider Note   ____________________________________________    I have reviewed the triage vital signs and the nursing notes.   HISTORY  Chief Complaint Wound Check     HPI [name] [name] is a 83 y.o. male Who presents with complaints of a wound to his left lateral hip. Wife has been taking care of small wound which developed approximately 1-2 weeks ago. She wants to know if it is infected. She has been cleaning it daily and putting Neosporin on it. It appears to have developed because of the bony protuberance and the patient sleeps on his left side   Past Medical History:  Diagnosis Date  . Atrial fibrillation ([place])   . Basal cell carcinoma   . CHF (congestive heart failure) ([place])   . Chronic kidney disease   . COPD (chronic obstructive pulmonary disease) ([place])   . Coronary artery disease   . Gout   . Hyperlipidemia   . Hypertension   . Myocardial infarction   . Pulmonary fibrosis ([place])   . Pulmonary HTN     Patient Active Problem List   Diagnosis Date Noted  . Chronic diastolic heart failure ([place]) 01/31/2015  . Atrial fibrillation ([place]) 01/31/2015  . Pulmonary HTN 01/31/2015    Past Surgical History:  Procedure Laterality Date  . CARDIAC CATHETERIZATION    . CARDIOVERSION    . CAROTID ENDARTERECTOMY Left   . HERNIA REPAIR    . REPLACEMENT TOTAL KNEE Left   . TOTAL HIP ARTHROPLASTY Right     Prior to Admission medications   Medication Sig Start Date End Date Taking? Authorizing Provider  albuterol (PROVENTIL HFA;VENTOLIN HFA) 108 (90 BASE) MCG/ACT inhaler Inhale 2 puffs into the lungs every 4 (four) hours as needed for wheezing or shortness of breath.    Historical Provider, MD  allopurinol (ZYLOPRIM) 300 MG tablet Take 150 mg by mouth daily.    Historical Provider, MD  ferrous sulfate 324 (65 FE) MG TBEC Take 1 tablet by mouth daily.    Historical Provider, MD  Fluticasone Furoate-Vilanterol  100-25 MCG/INH AEPB Inhale 1 puff into the lungs daily.    Historical Provider, MD  oxyCODONE (ROXICODONE) 5 MG immediate release tablet Take 1 tablet (5 mg total) by mouth every 6 (six) hours as needed for moderate pain. 08/22/16 08/22/17  Johnn Hai, PA-C  oxyCODONE-acetaminophen (ROXICET) 5-325 MG tablet Take 1 tablet by mouth every 6 (six) hours as needed for severe pain. 05/15/16   Charline Bills Cuthriell, PA-C  pantoprazole (PROTONIX) 40 MG tablet Take 40 mg by mouth daily.    Historical Provider, MD  potassium chloride SA (K-DUR,KLOR-CON) 20 MEQ tablet Take 20 mEq by mouth 2 (two) times daily.    Historical Provider, MD  simvastatin (ZOCOR) 40 MG tablet Take 20 mg by mouth daily.    Historical Provider, MD  torsemide (DEMADEX) 20 MG tablet Take 20 mg by mouth 2 (two) times daily.    Historical Provider, MD  Treprostinil (TYVASO) 0.6 MG/ML SOLN Inhale 18 mcg into the lungs 4 (four) times daily.    Historical Provider, MD  warfarin (COUMADIN) 2 MG tablet Take 2 mg by mouth daily.    Historical Provider, MD     Allergies Patient has no known allergies.  Family History  Problem Relation Age of Onset  . Heart attack Father   . Heart attack Brother     Social History Social History  Substance Use Topics  . Smoking  status: Former Smoker    Packs/day: 0.00    Years: 20.00    Quit date: 01/31/1971  . Smokeless tobacco: Never Used     Comment: quit 4 yrs ago  . Alcohol use 0.0 oz/week     Comment: occ    Review of Systems  Constitutional: No fever/chills     Gastrointestinal: No abdominal pain.  No nausea, no vomiting.    Musculoskeletal: Negative for hip Skin: small wound left hip     ____________________________________________   PHYSICAL EXAM:  VITAL SIGNS: ED Triage Vitals  Enc Vitals Group     BP 01/03/17 1122 (!) 92/51     Pulse Rate 01/03/17 1122 94     Resp 01/03/17 1122 18     Temp 01/03/17 1122 97.9 F (36.6 C)     Temp Source 01/03/17 1122 Oral      SpO2 01/03/17 1122 97 %     Weight 01/03/17 1125 140 lb (63.5 kg)     Height 01/03/17 1125 _0  (1.803 m)     Head Circumference --      Peak Flow --      Pain Score 01/03/17 1125 4     Pain Loc --      Pain Edu? --      Excl. in [place]? --     Constitutional: Alert and oriented. No acute distress. Pleasant and interactive Eyes: Conjunctivae are normal.  Head: Atraumatic.  Mouth/Throat: Mucous membranes are moist.   Cardiovascular: Normal rate, regular rhythm.  Respiratory: Normal respiratory effort.  No retractions. Genitourinary: deferred Musculoskeletal: No lower extremity tenderness nor edema.  Full range of motio Neurologic:  Normal speech and language. No gross focal neurologic deficits are appreciated.   Skin:  Skin is warm, dry. 2 x 2 cm circular round wound to the lateral left hip. Central area of granulation tissue, healthy pink tissue surrounding. No evidence of infection. No discharge. No spreading erythema   ____________________________________________   LABS (all labs ordered are listed, but only abnormal results are displayed)  Labs Reviewed - No data to display ____________________________________________  EKG   ____________________________________________  RADIOLOGY  None ____________________________________________   PROCEDURES  Procedure(s) performed: No    Critical Care performed: No ____________________________________________   INITIAL IMPRESSION / ASSESSMENT AND PLAN / ED COURSE  Pertinent labs & imaging results that were available during my care of the patient were reviewed by me and considered in my medical decision making (see chart for details).  Wound appears to have been well cared for, we will provide more advanced dressing have the patient continue daily wound care and dressing changes and have the patient follow up with wound clinic   ____________________________________________   FINAL CLINICAL IMPRESSION(S) / ED  DIAGNOSES  Final diagnoses:  Visit for wound check      NEW MEDICATIONS STARTED DURING THIS VISIT:  Discharge Medication List as of 01/03/2017  2:24 PM       Note:  This document was prepared using Dragon voice recognition software and may include unintentional dictation errors.    [name], MD 01/03/17 [phone]

## 2017-01-03 NOTE — ED Notes (Addendum)
Pt presents with sore on left hip. States he "sits a lot" and that he sits and lies on that side. Pt states it has gotten worse over the last month. Pt alert & oriented with NAD noted.   Pt's wound is round with granulation within the circle, and pink/red around wound.

## 2017-01-03 NOTE — ED Notes (Signed)
Pt discharged home after verbalizing understanding of discharge instructions; nad noted.

## 2017-01-05 ENCOUNTER — Encounter: Payer: Medicare Other | Attending: Nurse Practitioner | Admitting: Nurse Practitioner

## 2017-01-05 DIAGNOSIS — J8410 Pulmonary fibrosis, unspecified: Secondary | ICD-10-CM | POA: Insufficient documentation

## 2017-01-05 DIAGNOSIS — Z87891 Personal history of nicotine dependence: Secondary | ICD-10-CM | POA: Diagnosis not present

## 2017-01-05 DIAGNOSIS — M109 Gout, unspecified: Secondary | ICD-10-CM | POA: Diagnosis not present

## 2017-01-05 DIAGNOSIS — I252 Old myocardial infarction: Secondary | ICD-10-CM | POA: Diagnosis not present

## 2017-01-05 DIAGNOSIS — M1990 Unspecified osteoarthritis, unspecified site: Secondary | ICD-10-CM | POA: Insufficient documentation

## 2017-01-05 DIAGNOSIS — Z96641 Presence of right artificial hip joint: Secondary | ICD-10-CM | POA: Insufficient documentation

## 2017-01-05 DIAGNOSIS — L89220 Pressure ulcer of left hip, unstageable: Secondary | ICD-10-CM | POA: Insufficient documentation

## 2017-01-05 DIAGNOSIS — R634 Abnormal weight loss: Secondary | ICD-10-CM | POA: Insufficient documentation

## 2017-01-05 DIAGNOSIS — Z9981 Dependence on supplemental oxygen: Secondary | ICD-10-CM | POA: Diagnosis not present

## 2017-01-05 DIAGNOSIS — Z85828 Personal history of other malignant neoplasm of skin: Secondary | ICD-10-CM | POA: Diagnosis not present

## 2017-01-05 DIAGNOSIS — I272 Pulmonary hypertension, unspecified: Secondary | ICD-10-CM | POA: Insufficient documentation

## 2017-01-05 DIAGNOSIS — I130 Hypertensive heart and chronic kidney disease with heart failure and stage 1 through stage 4 chronic kidney disease, or unspecified chronic kidney disease: Secondary | ICD-10-CM | POA: Diagnosis not present

## 2017-01-05 DIAGNOSIS — J449 Chronic obstructive pulmonary disease, unspecified: Secondary | ICD-10-CM | POA: Insufficient documentation

## 2017-01-05 DIAGNOSIS — Z96652 Presence of left artificial knee joint: Secondary | ICD-10-CM | POA: Diagnosis not present

## 2017-01-05 DIAGNOSIS — I5032 Chronic diastolic (congestive) heart failure: Secondary | ICD-10-CM | POA: Insufficient documentation

## 2017-01-05 DIAGNOSIS — I4891 Unspecified atrial fibrillation: Secondary | ICD-10-CM | POA: Diagnosis not present

## 2017-01-05 DIAGNOSIS — I2510 Atherosclerotic heart disease of native coronary artery without angina pectoris: Secondary | ICD-10-CM | POA: Diagnosis not present

## 2017-01-05 DIAGNOSIS — N183 Chronic kidney disease, stage 3 (moderate): Secondary | ICD-10-CM | POA: Insufficient documentation

## 2017-01-06 NOTE — Progress Notes (Signed)
WING, SCHOCH ([id number]) Visit Report for 01/05/2017 Abuse/Suicide Risk Screen Details Patient Name: JOHN, WILLIAMSEN Date of Service: 01/05/2017 1:30 PM Medical Record Number: [id number] Patient Account Number: [id number] Date of Birth/Sex: 06-04-1929 (83 y.o. Male) Treating RN: [name] Primary Care Imogen Maddalena: [name] Other Clinician: Referring Buckley Bradly: [name] Treating Ameia Morency/Extender: Cathie Olden in Treatment: 0 Abuse/Suicide Risk Screen Items Answer ABUSE/SUICIDE RISK SCREEN: Has anyone close to you tried to hurt or harm you recentlyo No Do you feel uncomfortable with anyone in your familyo No Has anyone forced you do things that you didnot want to doo No Do you have any thoughts of harming yourselfo No Patient displays signs or symptoms of abuse and/or neglect. No Electronic Signature(s) Signed: 01/05/2017 5:07:31 PM By: [name], RN, BSN, [name] RN, BSN Entered By: [name], RN, BSN, [name] on 01/05/2017 14:06:00 [name] ([id number]) -------------------------------------------------------------------------------- Activities of Daily Living Details Patient Name: MAGIC, MOHLER Date of Service: 01/05/2017 1:30 PM Medical Record Number: [id number] Patient Account Number: [id number] Date of Birth/Sex: March 15, 1929 (83 y.o. Male) Treating RN: [name] Primary Care Chole Driver: [name] Other Clinician: Referring Edrees Valent: [name] Treating Jessalyn Hinojosa/Extender: Cathie Olden in Treatment: 0 Activities of Daily Living Items Answer Activities of Daily Living (Please select one for each item) Drive Automobile Completely Able Take Medications Completely Able Use Telephone Completely Able Care for Appearance Completely Able Use Toilet Completely Able Bath / Shower Completely Able Dress Self Completely Able Feed Self Completely Able Walk Completely Able Get In / Out Bed Completely Able Housework Completely Able Prepare Meals  Completely Able Handle Money Completely Able Shop for Self Completely Able Electronic Signature(s) Signed: 01/05/2017 5:07:31 PM By: [name], RN, BSN, [name] RN, BSN Entered By: [name], RN, BSN, [name] on 01/05/2017 14:06:12 [name] ([id number]) -------------------------------------------------------------------------------- Education Assessment Details Patient Name: [name] Date of Service: 01/05/2017 1:30 PM Medical Record Number: [id number] Patient Account Number: [id number] Date of Birth/Sex: 1929/09/16 (83 y.o. Male) Treating RN: [name] Primary Care Kyrin Gratz: [name] Other Clinician: Referring Rhema Boyett: [name] Treating Neima Lacross/Extender: Cathie Olden in Treatment: 0 Primary Learner Assessed: Patient Learning Preferences/Education Level/Primary Language Learning Preference: Explanation, Demonstration Highest Education Level: College or Above Preferred Language: English Cognitive Barrier Assessment/Beliefs Language Barrier: No Translator Needed: No Memory Deficit: No Emotional Barrier: No Cultural/Religious Beliefs Affecting Medical No Care: Physical Barrier Assessment Impaired Vision: Yes Glasses Impaired Hearing: Yes Decreased Hand dexterity: No Knowledge/Comprehension Assessment Knowledge Level: High Comprehension Level: High Ability to understand written High instructions: Ability to understand verbal High instructions: Motivation Assessment Anxiety Level: Calm Cooperation: Cooperative Education Importance: Acknowledges Need Interest in Health Problems: Asks Questions Perception: Coherent Willingness to Engage in Self- High Management Activities: Readiness to Engage in Self- High Management Activities: Electronic Signature(s) DARUIS, SWAIM ([id number]) Signed: 01/05/2017 5:07:31 PM By: [name], RN, BSN, [name] RN, BSN Entered By: [name], RN, BSN, [name] on 01/05/2017 14:06:37 [name]  ([id number]) -------------------------------------------------------------------------------- Fall Risk Assessment Details Patient Name: [name] Date of Service: 01/05/2017 1:30 PM Medical Record Number: [id number] Patient Account Number: [id number] Date of Birth/Sex: [birth year] (83 y.o. Male) Treating RN: [name] Primary Care Tomi Grandpre: [name] Other Clinician: Referring Midori Dado: [name] Treating Allan Bacigalupi/Extender: Cathie Olden in Treatment: 0 Fall Risk Assessment Items Have you had 2 or more falls in the last 12 monthso 0 Yes Have you had any fall that resulted in injury in the last 12 monthso 0 No FALL RISK ASSESSMENT: History of falling - immediate or  within 3 months 0 No Secondary diagnosis 15 Yes Ambulatory aid None/bed rest/wheelchair/nurse 0 No Crutches/cane/walker 15 Yes Furniture 0 No IV Access/Saline Lock 0 No Gait/Training Normal/bed rest/immobile 0 No Weak 10 Yes Impaired 0 No Mental Status Oriented to own ability 0 Yes Electronic Signature(s) Signed: 01/05/2017 5:07:31 PM By: [name], RN, BSN, [name] RN, BSN Entered By: [name], RN, BSN, [name] on 01/05/2017 14:07:07 [name] ([id number]) -------------------------------------------------------------------------------- Nutrition Risk Assessment Details Patient Name: [name] Date of Service: 01/05/2017 1:30 PM Medical Record Number: [id number] Patient Account Number: [id number] Date of Birth/Sex: [birth year] (83 y.o. Male) Treating RN: [name] Primary Care Terrian Sentell: [name] Other Clinician: Referring Daria Mcmeekin: [name] Treating Kenadi Miltner/Extender: Cathie Olden in Treatment: 0 Height (in): 71 Weight (lbs): 143 Body Mass Index (BMI): 19.9 Nutrition Risk Assessment Items NUTRITION RISK SCREEN: I have an illness or condition that made me change the kind and/or 0 No amount of food I eat I eat fewer than two meals per day 0 No I eat few fruits and  vegetables, or milk products 2 Yes I have three or more drinks of beer, liquor or wine almost every day 0 No I have tooth or mouth problems that make it hard for me to eat 0 No I don't always have enough money to buy the food I need 0 No I eat alone most of the time 0 No I take three or more different prescribed or over-the-counter drugs a 1 Yes day Without wanting to, I have lost or gained 10 pounds in the last six 0 No months I am not always physically able to shop, cook and/or feed myself 0 No Nutrition Protocols Good Risk Protocol Provide education on Moderate Risk Protocol 0 nutrition Electronic Signature(s) Signed: 01/05/2017 5:07:31 PM By: [name], RN, BSN, [name] RN, BSN Entered By: [name], RN, BSN, [name] on 01/05/2017 14:07:44

## 2017-01-06 NOTE — Progress Notes (Signed)
[name] [name], [name] [name] ([id number]) Visit Report for 01/05/2017 Chief Complaint Document Details Patient Name: [name] [name], [name] [name] Date of Service: 01/05/2017 1:30 PM Medical Record Number: [id number] Patient Account Number: [id number] Date of Birth/Sex: 1929-09-15 (83 y.o. Male) Treating RN: [name] Primary Care Provider: [name] Other Clinician: Referring Provider: [name] Treating Provider/Extender: Cathie Olden in Treatment: 0 Information Obtained from: Patient Chief Complaint The patient is here for initial evaluation of his left trocanter pressure ulcer Electronic Signature(s) Signed: 01/05/2017 3:43:16 PM By: Lawanda Cousins Entered By: Lawanda Cousins on 01/05/2017 15:43:16 [name] [name] ([id number]) -------------------------------------------------------------------------------- HPI Details Patient Name: [name] [name] Date of Service: 01/05/2017 1:30 PM Medical Record Number: [id number] Patient Account Number: [id number] Date of Birth/Sex: [birth year] (83 y.o. Male) Treating RN: [name] Primary Care Provider: [name] Other Clinician: Referring Provider: [name] Treating Provider/Extender: Cathie Olden in Treatment: 0 History of Present Illness Location: left trocanter Quality: denies pain Duration: the ulcer was noticed two weeks ago Context: ulcer is secondary to pressure Modifying Factors: unrelieved pressure is an exacerbating factor HPI Description: 01/05/17- the patient is here for initial violation of a left trochanter pressure ulcer that was originally noticed by his wife a few weeks ago. He states that he traditionally sleeps on his left side. He has lost between 20 and 30 pounds of the past 6 months. He cannot articulate a specific reason for this he states that it's not a loss of appetite. He denies getting fatigued with eating, despite having pulmonary fibrosis, COPD and being exertionally oxygen dependent. They have been  applying neosporin. Lab work obtained in January revealed an albumin level 3.7. He is a non-diabetic, former smoker (quitting > 40 yrs ago) Engineer, maintenance) Signed: 01/05/2017 3:54:17 PM By: Lawanda Cousins Previous Signature: 01/05/2017 3:46:45 PM Version By: Lawanda Cousins Entered By: Lawanda Cousins on 01/05/2017 15:54:16 [name] [name] ([id number]) -------------------------------------------------------------------------------- Physical Exam Details Patient Name: [name] [name] Date of Service: 01/05/2017 1:30 PM Medical Record Number: [id number] Patient Account Number: [id number] Date of Birth/Sex: 08-23-1929 (83 y.o. Male) Treating RN: [name] Primary Care Provider: [name] Other Clinician: Referring Provider: [name] Treating Provider/Extender: [name]: 0 Constitutional hypotensive- asymptomatic. afebrile. well nourished; well developed; appears stated age;Marland Kitchen Respiratory tachypnea- patients baseline/asymptomatic; o2 in use. clear to all fields. Cardiovascular S1 S2 with regular rate and rhythm. Musculoskeletal ambulates with walker. Integumentary (Hair, Skin) left trocanter- completely slough covered; no visible deeper structures, no palpable bone. Psychiatric appears to make sound judgement and have accurate insight regarding healthcare. oriented to time, place, person and situation. calm, pleasant, conversive. Electronic Signature(s) Signed: 01/05/2017 4:11:02 PM By: Lawanda Cousins Entered By: Lawanda Cousins on 01/05/2017 16:11:01 [name] [name] ([id number]) -------------------------------------------------------------------------------- Physician Orders Details Patient Name: [name] [name] Date of Service: 01/05/2017 1:30 PM Medical Record Number: [id number] Patient Account Number: [id number] Date of Birth/Sex: 09-03-1929 (83 y.o. Male) Treating RN: [name] Primary Care Provider: [name] Other  Clinician: Referring Provider: [name] Treating Provider/Extender: Cathie Olden in Treatment: 0 Verbal / Phone Orders: No Diagnosis Coding Wound Cleansing Wound #1 Left Trochanter o May Shower, gently pat wound dry prior to applying new dressing. Anesthetic Wound #1 Left Trochanter o Topical Lidocaine 4% cream applied to wound bed prior to debridement - in clinic only Skin Barriers/Peri-Wound Care Wound #1 Left Trochanter o Skin Prep Primary Wound Dressing Wound #1 Left Trochanter o Iodosorb Ointment Secondary Dressing Wound #1 Left Trochanter o Boardered Foam Dressing Dressing Change Frequency  Wound #1 Left Trochanter o Change dressing every other day. Follow-up Appointments Wound #1 Left Trochanter o Return Appointment in 1 week. Off-Loading Wound #1 Left Trochanter o Turn and reposition every 2 hours - Try to stay off of left hip. Additional Orders / Instructions Wound #1 Left Trochanter [name] [name] ([id number]) o Other: - Do not leave home without Oxygen. Devices o Bed- Insurance account manager) Signed: 01/05/2017 4:22:06 PM By: Lawanda Cousins Signed: 01/05/2017 5:07:31 PM By: [name], RN, BSN, [name] RN, BSN Entered By: [name], RN, BSN, [name] on 01/05/2017 14:37:52 [name] [name] ([id number]) -------------------------------------------------------------------------------- Problem List Details Patient Name: [name] [name] Date of Service: 01/05/2017 1:30 PM Medical Record Number: [id number] Patient Account Number: [id number] Date of Birth/Sex: 10/04/1929 (83 y.o. Male) Treating RN: Primary Care Provider: [name] Other Clinician: Referring Provider: [name] Treating Provider/Extender: Suella Grove in Treatment: 0 Active Problems ICD-10 Encounter Code Description Active Date Diagnosis L89.220 Pressure ulcer of left hip, unstageable 01/05/2017 Yes R63.4 Abnormal weight loss 01/05/2017  Yes J84.10 Pulmonary fibrosis, unspecified 01/05/2017 Yes Inactive Problems Resolved Problems Electronic Signature(s) Signed: 01/05/2017 3:42:29 PM By: Lawanda Cousins Previous Signature: 01/05/2017 3:41:50 PM Version By: Lawanda Cousins Entered By: Lawanda Cousins on 01/05/2017 15:42:29 [name] [name] ([id number]) -------------------------------------------------------------------------------- Progress Note Details Patient Name: [name] [name] Date of Service: 01/05/2017 1:30 PM Medical Record Number: [id number] Patient Account Number: [id number] Date of Birth/Sex: 1929-07-19 (83 y.o. Male) Treating RN: [name] Primary Care Provider: [name] Other Clinician: Referring Provider: [name] Treating Provider/Extender: Cathie Olden in Treatment: 0 Subjective Chief Complaint Information obtained from Patient The patient is here for initial evaluation of his left trocanter pressure ulcer History of Present Illness (HPI) The following HPI elements were documented for the patient's wound: Location: left trocanter Quality: denies pain Duration: the ulcer was noticed two weeks ago Context: ulcer is secondary to pressure Modifying Factors: unrelieved pressure is an exacerbating factor 01/05/17- the patient is here for initial violation of a left trochanter pressure ulcer that was originally noticed by his wife a few weeks ago. He states that he traditionally sleeps on his left side. He has lost between 20 and 30 pounds of the past 6 months. He cannot articulate a specific reason for this he states that it's not a loss of appetite. He denies getting fatigued with eating, despite having pulmonary fibrosis, COPD and being exertionally oxygen dependent. They have been applying neosporin. Lab work obtained in January revealed an albumin level 3.7. He is a non-diabetic, former smoker (quitting > 40 yrs ago) Wound History Patient presents with 1 open wound that has been present for  approximately 2 weeks. Patient has been treating wound in the following manner: neosporin. Laboratory tests have not been performed in the last month. Patient reportedly has not tested positive for an antibiotic resistant organism. Patient reportedly has not tested positive for osteomyelitis. Patient reportedly has not had testing performed to evaluate circulation in the legs. Patient History Information obtained from Patient. Allergies No Known Drug Allergies Family History Cancer - Siblings, Heart Disease - Father, Siblings, Hypertension - Father, Siblings, No family history of Diabetes, Kidney Disease, Lung Disease, Seizures, Stroke, Thyroid Problems, CAMILLO, QUADROS. ([id number]) Tuberculosis. Social History Former smoker - 50 years, Marital Status - Married, Alcohol Use - Moderate, Drug Use - No History, Caffeine Use - Never. Medical History Eyes Patient has history of Cataracts - removed Denies history of Glaucoma, Optic Neuritis Ear/Nose/Mouth/Throat Denies history of Chronic sinus problems/congestion, Middle ear problems  Hematologic/Lymphatic Denies history of Anemia, Hemophilia, Human Immunodeficiency Virus, Lymphedema, Sickle Cell Disease Respiratory Patient has history of Chronic Obstructive Pulmonary Disease (COPD) Denies history of Aspiration, Asthma, Pneumothorax, Sleep Apnea, Tuberculosis Cardiovascular Patient has history of Arrhythmia, Congestive Heart Failure, Coronary Artery Disease, Hypotension, Myocardial Infarction Denies history of Angina, Deep Vein Thrombosis, Hypertension, Peripheral Arterial Disease, Peripheral Venous Disease, Phlebitis, Vasculitis Gastrointestinal Denies history of Cirrhosis , Colitis, Crohn s, Hepatitis A, Hepatitis B, Hepatitis C Endocrine Denies history of Type I Diabetes, Type II Diabetes Genitourinary Denies history of End Stage Renal Disease Immunological Denies history of Lupus Erythematosus, Raynaud s Integumentary  (Skin) Denies history of History of Burn, History of pressure wounds Musculoskeletal Patient has history of Gout, Osteoarthritis Denies history of Rheumatoid Arthritis, Osteomyelitis Neurologic Denies history of Dementia, Neuropathy, Quadriplegia, Paraplegia, Seizure Disorder Oncologic Denies history of Received Chemotherapy, Received Radiation Psychiatric Denies history of Anorexia/bulimia, Confinement Anxiety Medical And Surgical History Notes Constitutional Symptoms (General Health) AFib; CHF; CKD; COPD; Coronary Artery Disease; Gout, Hyerlipidema, MI, Pulmonary Fibrosis, Pulmonary HTN Respiratory Pulmonary HTN Review of Systems (ROS) Constitutional Symptoms (General Health) JASAIAH, KARWOWSKI ([id number]) Complains or has symptoms of Fatigue, Marked Weight Change. Denies complaints or symptoms of Fever, Chills. Eyes Complains or has symptoms of Glasses / Contacts. Denies complaints or symptoms of Dry Eyes, Vision Changes. Ear/Nose/Mouth/Throat The patient has no complaints or symptoms. Hematologic/Lymphatic The patient has no complaints or symptoms. Respiratory Complains or has symptoms of Shortness of Breath. Denies complaints or symptoms of Chronic or frequent coughs. Cardiovascular The patient has no complaints or symptoms. Gastrointestinal The patient has no complaints or symptoms. Endocrine Denies complaints or symptoms of Hepatitis, Thyroid disease, Polydypsia (Excessive Thirst). Genitourinary The patient has no complaints or symptoms. Immunological The patient has no complaints or symptoms. Integumentary (Skin) Complains or has symptoms of Wounds, Bleeding or bruising tendency. Denies complaints or symptoms of Breakdown, Swelling. Musculoskeletal Complains or has symptoms of Muscle Weakness. Neurologic The patient has no complaints or symptoms. Oncologic The patient has no complaints or symptoms. Psychiatric The patient has no complaints or symptoms. He  carries a past medical and surgical history to include, but not limited to: Hyperlipidemia, CKD stage III, COPD, CAD, gout, hypertension, MI, pulmonary fibrosis, pulmonary hypertension, basal cell carcinoma, chronic diastolic heart failure, atrial fibrillation, left TKR (2005), right THR (2004), ventral hernia repair (2004), left carotid endarterectomy, cardioversion, cardiac cath Objective Constitutional hypotensive- asymptomatic. afebrile. well nourished; well developed; appears stated age;Marland Kitchen KABE, MCKOY ([id number]) Vitals Time Taken: 1:53 PM, Height: 71 in, Weight: 143 lbs, BMI: 19.9, Temperature: 97.8 F, Pulse: 97 bpm, Respiratory Rate: 18 breaths/min, Blood Pressure: 88/56 mmHg, Pulse Oximetry: 100 %. General Notes: 4L O2 Respiratory tachypnea- patients baseline/asymptomatic; o2 in use. clear to all fields. Cardiovascular S1 S2 with regular rate and rhythm. Musculoskeletal ambulates with walker. Psychiatric appears to make sound judgement and have accurate insight regarding healthcare. oriented to time, place, person and situation. calm, pleasant, conversive. Integumentary (Hair, Skin) left trocanter- completely slough covered; no visible deeper structures, no palpable bone. Wound #1 status is Open. Original cause of wound was Pressure Injury. The wound is located on the Left Trochanter. The wound measures 0.7cm length x 0.6cm width x 0.1cm depth; 0.33cm^2 area and 0.033cm^3 volume. The wound is limited to skin breakdown. There is no tunneling or undermining noted. There is a medium amount of serous drainage noted. The wound margin is flat and intact. There is no granulation within the wound bed. There is a large (67-100%) amount  of necrotic tissue within the wound bed including Adherent Slough. The periwound skin appearance exhibited: Erythema. The periwound skin appearance did not exhibit: Callus, Crepitus, Excoriation, Induration, Rash, Scarring, Dry/Scaly,  Maceration, Atrophie Blanche, Cyanosis, Ecchymosis, Hemosiderin Staining, Mottled, Pallor, Rubor. The surrounding wound skin color is noted with erythema which is circumferential. Assessment Active Problems ICD-10 L89.220 - Pressure ulcer of left hip, unstageable R63.4 - Abnormal weight loss J84.10 - Pulmonary fibrosis, unspecified ANDRAE, CLAUNCH ([id number]) - extensive education provided regarding need for increased nutrition/protein in light of significant weight loss - we will provide an order for a mattress overlay - we discussed the need for continued and recurrent skin assessment to higher risk areas (heels, sacrum, ischium, trocanter) - we discussed the need to reposition off of left trocanter, pillows for support Plan Wound Cleansing: Wound #1 Left Trochanter: May Shower, gently pat wound dry prior to applying new dressing. Anesthetic: Wound #1 Left Trochanter: Topical Lidocaine 4% cream applied to wound bed prior to debridement - in clinic only Skin Barriers/Peri-Wound Care: Wound #1 Left Trochanter: Skin Prep Primary Wound Dressing: Wound #1 Left Trochanter: Iodosorb Ointment Secondary Dressing: Wound #1 Left Trochanter: Boardered Foam Dressing Dressing Change Frequency: Wound #1 Left Trochanter: Change dressing every other day. Follow-up Appointments: Wound #1 Left Trochanter: Return Appointment in 1 week. Off-Loading: Wound #1 Left Trochanter: Turn and reposition every 2 hours - Try to stay off of left hip. Additional Orders / Instructions: Wound #1 Left Trochanter: Other: - Do not leave home without Oxygen. Devices ordered were: Bed- Mattress-Overlay or Replacement 1. iodosrob, foam border, change every other day RAKAN, SOFFER. ([id number]) 2. Medical Modalities for mattress overlay 3. follow up next week 4. increase protein- suggestions provided Electronic Signature(s) Signed: 01/05/2017 4:26:56 PM By: Lawanda Cousins Previous Signature:  01/05/2017 3:58:17 PM Version By: Lawanda Cousins Entered By: Lawanda Cousins on 01/05/2017 16:26:56 [name] [name] ([id number]) -------------------------------------------------------------------------------- ROS/PFSH Details Patient Name: [name] [name] Date of Service: 01/05/2017 1:30 PM Medical Record Number: [id number] Patient Account Number: [id number] Date of Birth/Sex: 07-17-1929 (83 y.o. Male) Treating RN: [name] Primary Care Provider: [name] Other Clinician: Referring Provider: [name] Treating Provider/Extender: Cathie Olden in Treatment: 0 Information Obtained From Patient Wound History Do you currently have one or more open woundso Yes How many open wounds do you currently haveo 1 Approximately how long have you had your woundso 2 weeks How have you been treating your wound(s) until nowo neosporin Has your wound(s) ever healed and then re-openedo No Have you had any lab work done in the past montho No Have you tested positive for an antibiotic resistant organism (MRSA, VRE)o No Have you tested positive for osteomyelitis (bone infection)o No Have you had any tests for circulation on your legso No Constitutional Symptoms (General Health) Complaints and Symptoms: Positive for: Fatigue; Marked Weight Change Negative for: Fever; Chills Medical History: Past Medical History Notes: AFib; CHF; CKD; COPD; Coronary Artery Disease; Gout, Hyerlipidema, MI, Pulmonary Fibrosis, Pulmonary HTN Eyes Complaints and Symptoms: Positive for: Glasses / Contacts Negative for: Dry Eyes; Vision Changes Medical History: Positive for: Cataracts - removed Negative for: Glaucoma; Optic Neuritis Hematologic/Lymphatic Complaints and Symptoms: No Complaints or Symptoms Complaints and Symptoms: Negative for: Bleeding / Clotting Disorders; Human Immunodeficiency Virus TABER, SWEETSER ([id number]) Medical History: Negative for: Anemia; Hemophilia; Human  Immunodeficiency Virus; Lymphedema; Sickle Cell Disease Respiratory Complaints and Symptoms: Positive for: Shortness of Breath Negative for: Chronic or frequent coughs Medical History: Positive for: Chronic Obstructive Pulmonary Disease (  COPD) Negative for: Aspiration; Asthma; Pneumothorax; Sleep Apnea; Tuberculosis Past Medical History Notes: Pulmonary HTN Endocrine Complaints and Symptoms: Negative for: Hepatitis; Thyroid disease; Polydypsia (Excessive Thirst) Medical History: Negative for: Type I Diabetes; Type II Diabetes Integumentary (Skin) Complaints and Symptoms: Positive for: Wounds; Bleeding or bruising tendency Negative for: Breakdown; Swelling Medical History: Negative for: History of Burn; History of pressure wounds Musculoskeletal Complaints and Symptoms: Positive for: Muscle Weakness Medical History: Positive for: Gout; Osteoarthritis Negative for: Rheumatoid Arthritis; Osteomyelitis Ear/Nose/Mouth/Throat Complaints and Symptoms: No Complaints or Symptoms Medical History: Negative for: Chronic sinus problems/congestion; Middle ear problems Cardiovascular KHALIEL, MOREY. ([id number]) Complaints and Symptoms: No Complaints or Symptoms Medical History: Positive for: Arrhythmia; Congestive Heart Failure; Coronary Artery Disease; Hypotension; Myocardial Infarction Negative for: Angina; Deep Vein Thrombosis; Hypertension; Peripheral Arterial Disease; Peripheral Venous Disease; Phlebitis; Vasculitis Gastrointestinal Complaints and Symptoms: No Complaints or Symptoms Medical History: Negative for: Cirrhosis ; Colitis; Crohnos; Hepatitis A; Hepatitis B; Hepatitis C Genitourinary Complaints and Symptoms: No Complaints or Symptoms Medical History: Negative for: End Stage Renal Disease Immunological Complaints and Symptoms: No Complaints or Symptoms Medical History: Negative for: Lupus Erythematosus; Raynaudos Neurologic Complaints and Symptoms: No  Complaints or Symptoms Medical History: Negative for: Dementia; Neuropathy; Quadriplegia; Paraplegia; Seizure Disorder Oncologic Complaints and Symptoms: No Complaints or Symptoms Medical History: Negative for: Received Chemotherapy; Received Radiation Psychiatric TYRESS, LODEN ([id number]) Complaints and Symptoms: No Complaints or Symptoms Medical History: Negative for: Anorexia/bulimia; Confinement Anxiety HBO Extended History Items Eyes: Cataracts Immunizations Pneumococcal Vaccine: Received Pneumococcal Vaccination: Yes Family and Social History Cancer: Yes - Siblings; Diabetes: No; Heart Disease: Yes - Father, Siblings; Hypertension: Yes - Father, Siblings; Kidney Disease: No; Lung Disease: No; Seizures: No; Stroke: No; Thyroid Problems: No; Tuberculosis: No; Former smoker - 49 years; Marital Status - Married; Alcohol Use: Moderate; Drug Use: No History; Caffeine Use: Never; Financial Concerns: No; Food, Clothing or Shelter Needs: No; Support System Lacking: No; Transportation Concerns: No; Advanced Directives: Yes; Living Will: Yes (Copy provided); Medical Power of Attorney: Yes - wife (Copy provided) Electronic Signature(s) Signed: 01/05/2017 4:22:06 PM By: Lawanda Cousins Signed: 01/05/2017 5:07:31 PM By: [name] RN, BSN, [name] RN, BSN Entered By: [name], RN, BSN, [name] on 01/05/2017 14:05:53 [name] [name] ([id number]) -------------------------------------------------------------------------------- [place] Details Patient Name: [name] [name] Date of Service: 01/05/2017 Medical Record Number: [id number] Patient Account Number: [id number] Date of Birth/Sex: [birth year] (83 y.o. Male) Treating RN: [name] Primary Care Provider: [name] Other Clinician: Referring Provider: [name] Treating Provider/Extender: Cathie Olden in Treatment: 0 Diagnosis Coding ICD-10 Codes Code Description [phone] Pressure ulcer of left hip, unstageable R63.4  Abnormal weight loss J84.10 Pulmonary fibrosis, unspecified Facility Procedures CPT4 Code: [id number] Description: 93716 - WOUND CARE VISIT-LEV 4 EST PT Modifier: Quantity: 1 Physician Procedures CPT4 Code: 9678938 Description: WC PHYS LEVEL 3 o NEW PT ICD-10 Description Diagnosis L89.220 Pressure ulcer of left hip, unstageable R63.4 Abnormal weight loss Modifier: Quantity: 1 Electronic Signature(s) Signed: 01/05/2017 4:11:34 PM By: Lawanda Cousins Entered By: Lawanda Cousins on 01/05/2017 16:11:34

## 2017-01-06 NOTE — Progress Notes (Signed)
[name] [name] ([id number]) Visit Report for 01/05/2017 Allergy List Details Patient Name: [name] [name], [name] [name] Date of Service: 01/05/2017 1:30 PM Medical Record Number: [id number] Patient Account Number: [id number] Date of Birth/Sex: 1929-06-11 (83 y.o. Male) Treating RN: [name] Primary Care [name]: [name] Other Clinician: Referring [name]: [name] Treating [name]: [name]: 0 Allergies Active Allergies No Known Drug Allergies Allergy Notes Electronic Signature(s) Signed: 01/05/2017 5:07:31 PM By: [name], RN, BSN, [name] RN, BSN Entered By: [name], RN, BSN, [name] on 01/05/2017 13:55:43 [name] [name] ([id number]) -------------------------------------------------------------------------------- Arrival Information Details Patient Name: [name] [name] Date of Service: 01/05/2017 1:30 PM Medical Record Number: [id number] Patient Account Number: [id number] Date of Birth/Sex: 1928/12/13 (83 y.o. Male) Treating RN: [name] Primary Care Vernal Hritz: [name] Other Clinician: Referring Anysha Frappier: [name] Treating Edessa Jakubowicz/Extender: Cathie Olden in Treatment: 0 Visit Information Patient Arrived: [name] [name] Time: 13:45 Accompanied By: wife Transfer Assistance: None Patient Identification Verified: Yes Secondary Verification Process Yes Completed: Patient Requires Transmission- No Based Precautions: Patient Has Alerts: Yes Patient Alerts: Patient on Blood Thinner Warfarin NOT Diabetic Electronic Signature(s) Signed: 01/05/2017 5:07:31 PM By: [name], RN, BSN, [name] RN, BSN Entered By: [name], RN, BSN, [name] on 01/05/2017 13:53:35 [name] [name] ([id number]) -------------------------------------------------------------------------------- Clinic Level of Care Assessment Details Patient Name: [name] [name] Date of Service: 01/05/2017 1:30 PM Medical Record Number: [id number] Patient Account Number:  [id number] Date of Birth/Sex: [birth year] (83 y.o. Male) Treating RN: [name] Primary Care Tarrell Debes: [name] Other Clinician: Referring Kalyb Pemble: [name] Treating Ahmira Boisselle/Extender: Cathie Olden in Treatment: 0 Clinic Level of Care Assessment Items TOOL 2 Quantity Score _0  - Use when only an EandM is performed on the INITIAL visit 0 ASSESSMENTS - Nursing Assessment / Reassessment X - General Physical Exam (combine w/ comprehensive assessment (listed just 1 20 below) when performed on new pt. evals) X - Comprehensive Assessment (HX, ROS, Risk Assessments, Wounds Hx, etc.) 1 25 ASSESSMENTS - Wound and Skin Assessment / Reassessment X - Simple Wound Assessment / Reassessment - one wound 1 5 _1  - Complex Wound Assessment / Reassessment - multiple wounds 0 _2  - Dermatologic / Skin Assessment (not related to wound area) 0 ASSESSMENTS - Ostomy and/or Continence Assessment and Care _3  - Incontinence Assessment and Management 0 _4  - Ostomy Care Assessment and Management (repouching, etc.) 0 PROCESS - Coordination of Care X - Simple Patient / Family Education for ongoing care 1 15 _5  - Complex (extensive) Patient / Family Education for ongoing care 0 X - Staff obtains Programmer, systems, Records, Test Results / Process Orders 1 10 _6  - Staff telephones HHA, Nursing Homes / Clarify orders / etc 0 _7  - Routine Transfer to another Facility (non-emergent condition) 0 _8  - Routine Hospital Admission (non-emergent condition) 0 X - New Admissions / Biomedical engineer / Ordering NPWT, Apligraf, etc. 1 15 _9  - Emergency Hospital Admission (emergent condition) 0 X - Simple Discharge Coordination 1 10 [name] [name], SINNING. ([id number]) _10  - Complex (extensive) Discharge Coordination 0 PROCESS - Special Needs _11  - Pediatric / Minor Patient Management 0 _12  - Isolation Patient Management 0 _13  - Hearing / Language / Visual special needs 0 _14  - Assessment of Community assistance  (transportation, D/C planning, etc.) 0 _15  - Additional assistance / Altered mentation 0 X - Support Surface(s) Assessment (bed, cushion, seat, etc.) 1 15 INTERVENTIONS - Wound Cleansing / Measurement X - Wound Imaging (photographs - any number of wounds) 1 5 _16  - Wound Tracing (instead of  photographs) 0 X - Simple Wound Measurement - one wound 1 5 _0  - Complex Wound Measurement - multiple wounds 0 X - Simple Wound Cleansing - one wound 1 5 _1  - Complex Wound Cleansing - multiple wounds 0 INTERVENTIONS - Wound Dressings X - Small Wound Dressing one or multiple wounds 1 10 _2  - Medium Wound Dressing one or multiple wounds 0 _3  - Large Wound Dressing one or multiple wounds 0 <[id number]>_3<\/[id number]>_6  - Application of Medications - injection 0 INTERVENTIONS - Miscellaneous _5  - External ear exam 0 _6  - Specimen Collection (cultures, biopsies, blood, body fluids, etc.) 0 _7  - Specimen(s) / Culture(s) sent or taken to Lab for analysis 0 _8  - Patient Transfer (multiple staff / Civil Service fast streamer / Similar devices) 0 _9  - Simple Staple / Suture removal (25 or less) 0 _10  - Complex Staple / Suture removal (26 or more) 0 [name] [name], [name] G. ([id number]) _11  - Hypo / Hyperglycemic Management (close monitor of Blood Glucose) 0 _12  - Ankle / Brachial Index (ABI) - do not check if billed separately 0 Has the patient been seen at the hospital within the last three years: Yes Total Score: 140 Level Of Care: New/Established - Level 4 Electronic Signature(s) Signed: 01/05/2017 5:07:31 PM By: [name], RN, BSN, [name] RN, BSN Entered By: [name], RN, BSN, [name] on 01/05/2017 15:17:55 [name] [name] ([id number]) -------------------------------------------------------------------------------- Encounter Discharge Information Details Patient Name: [name] [name] Date of Service: 01/05/2017 1:30 PM Medical Record Number: [id number] Patient Account Number: [id number] Date of Birth/Sex: August 13, 1929 (83 y.o. Male) Treating RN: [name] Primary  Care Quan Cybulski: [name] Other Clinician: Referring Bashir Marchetti: [name] Treating Namira Rosekrans/Extender: Cathie Olden in Treatment: 0 Encounter Discharge Information Items Discharge Pain Level: 0 Discharge Condition: Stable Ambulatory Status: Wheelchair Discharge Destination: Home Transportation: Private Auto Accompanied By: wife Schedule Follow-up Appointment: Yes Medication Reconciliation completed Yes and provided to Patient/Care Afia Messenger: Provided on Clinical Summary of Care: 01/05/2017 Form Type Recipient Paper Patient CG Electronic Signature(s) Signed: 01/05/2017 5:07:31 PM By: [name] RN, BSN, [name] RN, BSN Previous Signature: 01/05/2017 2:50:37 PM Version By: Ruthine Dose Entered By: [name] RN, BSN, [name] on 01/05/2017 15:20:32 [name] [name] ([id number]) -------------------------------------------------------------------------------- General Visit Notes Details Patient Name: [name] [name] Date of Service: 01/05/2017 1:30 PM Medical Record Number: [id number] Patient Account Number: [id number] Date of Birth/Sex: [birth year] (83 y.o. Male) Treating RN: [name] Primary Care Akila Batta: [name] Other Clinician: Referring Kymani Shimabukuro: [name] Treating Jamire Shabazz/Extender: Cathie Olden in Treatment: 0 Notes Patient Came in today with his wife. Once approaching the treatment room using a walker, he was short of breath. I checked his oxygen level and reading was 62%. NP was notified. Sent for an O2 tank. At that time the patient's wife stated that patient was normally on 4L of O2 at home always at night and during the day when needed. Once O2 supplied by nasal cannula at 4L, O2 sats fluctuated from 95-100%. Lawanda Cousins, NP examined patient, patient states he does not feeling any worse than usual. Denny Peon suggested in the future the patient take his O2 tank with him wherever he goes. Patient and wife understand and agree. Upon leaving the clinic. I  offered to take the patient out to his car in a wheelchair using O2 until he got into the car. Wife states they were going straight home from the [facility]. Electronic Signature(s) Signed: 01/05/2017 3:51:56 PM By: Lawanda Cousins Entered By: Lawanda Cousins on 01/05/2017 15:51:56 [name] [name] ([id number]) -------------------------------------------------------------------------------- Lower Extremity Assessment  Details Patient Name: [name] [name], [name] [name] Date of Service: 01/05/2017 1:30 PM Medical Record Number: [id number] Patient Account Number: [id number] Date of Birth/Sex: [birth year] (83 y.o. Male) Treating RN: [name] Primary Care Lajuanna Pompa: [name] Other Clinician: Referring Elizabeht Suto: [name] Treating Tanaiya Kolarik/Extender: Cathie Olden in Treatment: 0 Electronic Signature(s) Signed: 01/05/2017 5:07:31 PM By: [name], RN, BSN, [name] RN, BSN Entered By: [name], RN, BSN, [name] on 01/05/2017 14:16:50 [name] [name] ([id number]) -------------------------------------------------------------------------------- Multi Wound Chart Details Patient Name: [name] [name] Date of Service: 01/05/2017 1:30 PM Medical Record Number: [id number] Patient Account Number: [id number] Date of Birth/Sex: 11-23-1928 (83 y.o. Male) Treating RN: [name] Primary Care Nayson Traweek: [name] Other Clinician: Referring Timmie Calix: [name] Treating Treavor Blomquist/Extender: Cathie Olden in Treatment: 0 Vital Signs Height(in): 71 Pulse(bpm): 97 Weight(lbs): 143 Blood Pressure 88/56 (mmHg): Body Mass Index(BMI): 20 Temperature(F): 97.8 Respiratory Rate 18 (breaths/min): Photos: [N/A:N/A] Wound Location: Left Trochanter N/A N/A Wounding Event: Pressure Injury N/A N/A Primary Etiology: Pressure Ulcer N/A N/A Comorbid History: Cataracts, Chronic N/A N/A Obstructive Pulmonary Disease (COPD), Arrhythmia, Congestive Heart Failure, Coronary Artery Disease, Hypotension,  Myocardial Infarction, Gout, Osteoarthritis Date Acquired: 12/22/2016 N/A N/A Weeks of Treatment: 0 N/A N/A Wound Status: Open N/A N/A Measurements L x W x D 0.7x0.6x0.1 N/A N/A (cm) Area (cm) : 0.33 N/A N/A Volume (cm) : 0.033 N/A N/A % Reduction in Area: 0.00% N/A N/A % Reduction in Volume: 0.00% N/A N/A Classification: Unstageable/Unclassified N/A N/A Exudate Amount: None Present N/A N/A Wound Margin: Flat and Intact N/A N/A Granulation Amount: None Present (0%) N/A N/A [name] [name], [name] [name] ([id number]) Necrotic Amount: Large (67-100%) N/A N/A Exposed Structures: Fascia: No N/A N/A Fat Layer (Subcutaneous Tissue) Exposed: No Tendon: No Muscle: No Joint: No Bone: No Limited to Skin Breakdown Epithelialization: None N/A N/A Periwound Skin Texture: Excoriation: No N/A N/A Induration: No Callus: No Crepitus: No Rash: No Scarring: No Periwound Skin Maceration: No N/A N/A Moisture: Dry/Scaly: No Periwound Skin Color: Erythema: Yes N/A N/A Atrophie Blanche: No Cyanosis: No Ecchymosis: No Hemosiderin Staining: No Mottled: No Pallor: No Rubor: No Erythema Location: Circumferential N/A N/A Tenderness on No N/A N/A Palpation: Wound Preparation: Ulcer Cleansing: N/A N/A Rinsed/Irrigated with Saline Topical Anesthetic Applied: Other: lidocaine 4% Treatment Notes Wound #1 (Left Trochanter) 1. Cleansed with: Clean wound with Normal Saline 2. Anesthetic Topical Lidocaine 4% cream to wound bed prior to debridement 4. Dressing Applied: Iodosorb Ointment 5. Secondary Dressing Applied Bordered Foam Dressing Dry Gauze [name] [name], [name] [name] ([id number]) Electronic Signature(s) Signed: 01/05/2017 3:42:49 PM By: Lawanda Cousins Entered By: Lawanda Cousins on 01/05/2017 15:42:48 [name] [name] ([id number]) -------------------------------------------------------------------------------- [place] Details Patient Name: [name] [name] Date of  Service: 01/05/2017 1:30 PM Medical Record Number: [id number] Patient Account Number: [id number] Date of Birth/Sex: 1928/12/22 (83 y.o. Male) Treating RN: [name] Primary Care Kashmir Leedy: [name] Other Clinician: Referring Cailah Reach: [name] Treating Jaxson Anglin/Extender: Cathie Olden in Treatment: 0 Active Inactive ` Abuse / Safety / Falls / Self Care Management Nursing Diagnoses: Impaired physical mobility Potential for falls Goals: Patient/caregiver will verbalize understanding of skin care regimen Date Initiated: 01/05/2017 Target Resolution Date: 03/29/2017 Goal Status: Active Patient/caregiver will verbalize/demonstrate measures taken to prevent injury and/or falls Date Initiated: 01/05/2017 Target Resolution Date: 03/29/2017 Goal Status: Active Interventions: Assess fall risk on admission and as needed Assess self care needs on admission and as needed Notes: ` Orientation to the Wound Care Program Nursing Diagnoses: Knowledge deficit related to the wound healing center program Goals:  Patient/caregiver will verbalize understanding of the [facility] Date Initiated: 01/05/2017 Target Resolution Date: 03/29/2017 Goal Status: Active Interventions: Provide education on orientation to the wound center Notes: [name] [name], [name] [name] ([id number]) Pressure Nursing Diagnoses: Knowledge deficit related to management of pressures ulcers Goals: Patient will remain free of pressure ulcers Date Initiated: 01/05/2017 Target Resolution Date: 03/29/2017 Goal Status: Active Patient/caregiver will verbalize understanding of pressure ulcer management Date Initiated: 01/05/2017 Target Resolution Date: 03/29/2017 Goal Status: Active Interventions: Assess offloading mechanisms upon admission and as needed Notes: ` Wound/Skin Impairment Nursing Diagnoses: Knowledge deficit related to ulceration/compromised skin integrity Goals: Ulcer/skin breakdown will heal  within 14 weeks Date Initiated: 01/05/2017 Target Resolution Date: 04/06/2017 Goal Status: Active Interventions: Assess patient/caregiver ability to perform ulcer/skin care regimen upon admission and as needed Assess ulceration(s) every visit Treatment Activities: Skin care regimen initiated : 01/05/2017 Topical wound management initiated : 01/05/2017 Notes: Electronic Signature(s) Signed: 01/05/2017 5:07:31 PM By: [name], RN, BSN, [name] RN, BSN Entered By: [name], RN, BSN, [name] on 01/05/2017 14:29:53 [name] [name] ([id number]) -------------------------------------------------------------------------------- Pain Assessment Details Patient Name: [name] [name] Date of Service: 01/05/2017 1:30 PM Medical Record Number: [id number] Patient Account Number: [id number] Date of Birth/Sex: [birth year] (83 y.o. Male) Treating RN: [name] Primary Care Aubrionna Istre: [name] Other Clinician: Referring Shamarie Call: [name] Treating Fritz Cauthon/Extender: Cathie Olden in Treatment: 0 Active Problems Location of Pain Severity and Description of Pain Patient Has Paino No Site Locations With Dressing Change: No Pain Management and Medication Current Pain Management: Goals for Pain Management Topical or injectable lidocaine is offered to patient for acute pain when surgical debridement is performed. If needed, Patient is instructed to use over the counter pain medication for the following 24-48 hours after debridement. Wound care MDs do not prescribed pain medications. Patient has chronic pain or uncontrolled pain. Patient has been instructed to make an appointment with their Primary Care Physician for pain management. Electronic Signature(s) Signed: 01/05/2017 5:07:31 PM By: [name], RN, BSN, [name] RN, BSN Entered By: [name], RN, BSN, [name] on 01/05/2017 13:53:47 [name] [name] ([id number]) -------------------------------------------------------------------------------- Patient/Caregiver  Education Details Patient Name: [name] [name] Date of Service: 01/05/2017 1:30 PM Medical Record Number: [id number] Patient Account Number: [id number] Date of Birth/Gender: 14-Apr-1929 (83 y.o. Male) Treating RN: [name] Primary Care Physician: [name] Other Clinician: Referring Physician: [name] Treating Physician/Extender: Cathie Olden in Treatment: 0 Education Assessment Education Provided To: Patient and Caregiver Education Topics Provided Pressure: Handouts: Pressure Ulcers: Care and Offloading, Preventing Pressure Ulcers Methods: Demonstration, Explain/Verbal Responses: State content correctly Welcome To [facility]: Methods: Demonstration Responses: State content correctly Electronic Signature(s) Signed: 01/05/2017 5:07:31 PM By: [name], RN, BSN, [name] RN, BSN Entered By: [name], RN, BSN, [name] on 01/05/2017 15:20:55 [name] [name] ([id number]) -------------------------------------------------------------------------------- Wound Assessment Details Patient Name: [name] [name] Date of Service: 01/05/2017 1:30 PM Medical Record Number: [id number] Patient Account Number: [id number] Date of Birth/Sex: [birth year] (83 y.o. Male) Treating RN: [name] Primary Care Juda Lajeunesse: [name] Other Clinician: Referring Tejal Monroy: [name] Treating Haniel Fix/Extender: Cathie Olden in Treatment: 0 Wound Status Wound Number: 1 Primary Pressure Ulcer Etiology: Wound Location: Left Trochanter Wound Open Wounding Event: Pressure Injury Status: Date Acquired: 12/22/2016 Comorbid Cataracts, Chronic Obstructive Weeks Of Treatment: 0 History: Pulmonary Disease (COPD), Arrhythmia, Clustered Wound: No Congestive Heart Failure, Coronary Artery Disease, Hypotension, Myocardial Infarction, Gout, Osteoarthritis Photos Wound Measurements Length: (cm) 0.7 Width: (cm) 0.6 Depth: (cm) 0.1 Area: (cm) 0.33 Volume: (cm) 0.033 %  Reduction  in Area: 0% % Reduction in Volume: 0% Epithelialization: None Tunneling: No Undermining: No Wound Description Classification: Unstageable/Unclassified Wound Margin: Flat and Intact Exudate Amount: Medium Exudate Type: Serous Exudate Color: amber Foul Odor After Cleansing: No Slough/Fibrino Yes Wound Bed Granulation Amount: None Present (0%) Exposed Structure Necrotic Amount: Large (67-100%) Fascia Exposed: No Necrotic Quality: Adherent Slough Fat Layer (Subcutaneous Tissue) Exposed: No Tendon Exposed: No Muscle Exposed: No [name] [name], [name] [name] ([id number]) Joint Exposed: No Bone Exposed: No Limited to Skin Breakdown Periwound Skin Texture Texture Color No Abnormalities Noted: No No Abnormalities Noted: No Callus: No Atrophie Blanche: No Crepitus: No Cyanosis: No Excoriation: No Ecchymosis: No Induration: No Erythema: Yes Rash: No Erythema Location: Circumferential Scarring: No Hemosiderin Staining: No Mottled: No Moisture Pallor: No No Abnormalities Noted: No Rubor: No Dry / Scaly: No Maceration: No Wound Preparation Ulcer Cleansing: Rinsed/Irrigated with Saline Topical Anesthetic Applied: Other: lidocaine 4%, Treatment Notes Wound #1 (Left Trochanter) 1. Cleansed with: Clean wound with Normal Saline 2. Anesthetic Topical Lidocaine 4% cream to wound bed prior to debridement 4. Dressing Applied: Iodosorb Ointment 5. Secondary Dressing Applied Bordered Foam Dressing Dry Gauze Electronic Signature(s) Signed: 01/05/2017 5:07:31 PM By: [name], RN, BSN, [name] RN, BSN Entered By: [name], RN, BSN, [name] on 01/05/2017 16:07:46 [name] [name] ([id number]) -------------------------------------------------------------------------------- [place] Details Patient Name: [name] [name] Date of Service: 01/05/2017 1:30 PM Medical Record Number: [id number] Patient Account Number: [id number] Date of Birth/Sex: [birth year] (82 y.o. Male) Treating RN: [name] Primary  Care Delford Wingert: [name] Other Clinician: Referring Sabrina Keough: [name] Treating Willetta York/Extender: Cathie Olden in Treatment: 0 Vital Signs Time Taken: 13:53 Temperature (F): 97.8 Height (in): 71 Pulse (bpm): 97 Weight (lbs): 143 Respiratory Rate (breaths/min): 18 Body Mass Index (BMI): 19.9 Blood Pressure (mmHg): 88/56 Reference Range: 80 - 120 mg / dl Pulse Oximetry (%): 100 Notes 4L O2 Electronic Signature(s) Signed: 01/05/2017 5:07:31 PM By: [name], RN, BSN, [name] RN, BSN Entered By: [name], RN, BSN, [name] on 01/05/2017 13:54:40

## 2017-01-12 ENCOUNTER — Encounter: Payer: Medicare Other | Admitting: Internal Medicine

## 2017-01-12 DIAGNOSIS — L89220 Pressure ulcer of left hip, unstageable: Secondary | ICD-10-CM | POA: Diagnosis not present

## 2017-01-14 NOTE — Progress Notes (Signed)
DORIN, [place]OO[place] ([id number]) Visit Report for 01/12/2017 Chief Complaint Document Details Patient Name: [name] [name], [name] [name] Date of Service: 01/12/2017 12:30 PM Medical Record Number: [id number] Patient Account Number: [id number] Date of Birth/Sex: 1928/11/24 (83 y.o. Male) Treating [name]: [name] [name] Primary Care Provider: Emily [name] Other Clinician: Referring Provider: Emily [name] Treating Provider/Extender: [name] [name] in Treatment: 1 Information Obtained from: Patient Chief Complaint The patient is here for initial evaluation of his left trocanter pressure ulcer Electronic Signature(s) Signed: 01/12/2017 5:13:56 PM By: [name] Ham MD Entered By: [name] [name] on 01/12/2017 14:11:19 [name] [name] ([id number]) -------------------------------------------------------------------------------- HPI Details Patient Name: [name] [name] Date of Service: 01/12/2017 12:30 PM Medical Record Number: [id number] Patient Account Number: [id number] Date of Birth/Sex: 26-Mar-1929 (83 y.o. Male) Treating [name]: [name] [name] Primary Care Provider: Emily [name] Other Clinician: Referring Provider: Emily [name] Treating Provider/Extender: [name] [name] in Treatment: 1 History of Present Illness Location: left trocanter Quality: denies pain Duration: the ulcer was noticed two [name] ago Context: ulcer is secondary to pressure Modifying Factors: unrelieved pressure is an exacerbating factor HPI Description: 01/05/17- the patient is here for initial violation of a left trochanter pressure ulcer that was originally noticed by his wife a few [name] ago. He states that he traditionally sleeps on his left side. He has lost between 20 and 30 pounds of the past 6 months. He cannot articulate a specific reason for this he states that it's not a loss of appetite. He denies getting fatigued with eating, despite having pulmonary fibrosis, COPD and being exertionally oxygen dependent.  They have been applying neosporin. Lab work obtained in January revealed an albumin level 3.7. He is a non-diabetic, former smoker (quitting > 40 yrs ago) 01/12/17; left trochanter ulcer. smaller using iodosorb which was started last week. He is hypoxic. does not keep O2 on reliable at least here. When this happen P02 drops in the 70s Electronic Signature(s) Signed: 01/12/2017 5:13:56 PM By: [name] Ham MD Entered By: [name] [name] on 01/12/2017 14:13:42 [name] [name] ([id number]) -------------------------------------------------------------------------------- Physical Exam Details Patient Name: [name] [name] Date of Service: 01/12/2017 12:30 PM Medical Record Number: [id number] Patient Account Number: [id number] Date of Birth/Sex: December 01, 1928 (83 y.o. Male) Treating [name]: [name] [name] Primary Care Provider: Emily [name] Other Clinician: Referring Provider: Emily [name] Treating Provider/Extender: [name] [name] in Treatment: 1 Constitutional Sitting or standing Blood Pressure is within target range for patient.. Pulse regular and within target range for patient.Marland Kitchen Respirations regular, non-labored and within target range.. Temperature is normal and within the target range for the patient.. Patient's appearance is neat and clean. Appears in no acute distress. Well nourished and well developed.. Eyes Conjunctivae clear. No discharge.Marland Kitchen Respiratory course bilaterla crackles. no wheezing. Cardiovascular Heart rhythm and rate regular, without murmur or gallop. JVP not elevated. Pedal pulses palpable and strong bilaterally.Marland Kitchen Psychiatric Patient appears depressed today.. Notes Wound exam small open on left greater trochanter. surface is dry but still with some excar. No infection Electronic Signature(s) Signed: 01/12/2017 5:13:56 PM By: [name] Ham MD Entered By: [name] [name] on 01/12/2017 14:15:37 [name] [name]  ([id number]) -------------------------------------------------------------------------------- Physician Orders Details Patient Name: [name] [name] Date of Service: 01/12/2017 12:30 PM Medical Record Number: [id number] Patient Account Number: [id number] Date of Birth/Sex: 1929/04/11 (83 y.o. Male) Treating [name]: [name] [name] Primary Care Provider: Emily [name] Other Clinician: Referring Provider: Emily [name] Treating Provider/Extender: [name] [name] in Treatment: 1 Verbal / Phone Orders: No Diagnosis Coding Wound Cleansing Wound #1 Left  Trochanter o May Shower, gently pat wound dry prior to applying new dressing. Anesthetic Wound #1 Left Trochanter o Topical Lidocaine 4% cream applied to wound bed prior to debridement - in clinic only Skin Barriers/Peri-Wound Care Wound #1 Left Trochanter o Skin Prep Primary Wound Dressing Wound #1 Left Trochanter o Iodosorb Ointment Secondary Dressing Wound #1 Left Trochanter o Boardered Foam Dressing Dressing Change Frequency Wound #1 Left Trochanter o Change dressing every other day. Follow-up Appointments Wound #1 Left Trochanter o Return Appointment in 1 week. Off-Loading Wound #1 Left Trochanter o Turn and reposition every 2 hours - Try to stay off of left hip. Additional Orders / Instructions Wound #1 Left Trochanter [name] [name], [name] [name] ([id number]) o Other: - Do not leave home without Oxygen. Electronic Signature(s) Signed: 01/12/2017 5:13:56 PM By: [name] Ham MD Signed: 01/13/2017 10:32:11 AM By: [name] [name], [name] [name], [name] [name], [name] [name] Entered By: [name] Cool, [name], [name] [name], [name] on 01/12/2017 13:01:23 [name] [name] ([id number]) -------------------------------------------------------------------------------- Problem List Details Patient Name: [name] [name], [name] [name] Date of Service: 01/12/2017 12:30 PM Medical Record Number: [id number] Patient Account Number: [id number] Date of Birth/Sex: [birth year] (83 y.o.  Male) Treating [name]: [name] [name] Primary Care Provider: Emily [name] Other Clinician: Referring Provider: Emily [name] Treating Provider/Extender: [name] [name] in Treatment: 1 Active Problems ICD-10 Encounter Code Description Active Date Diagnosis L89.220 Pressure ulcer of left hip, unstageable 01/05/2017 Yes R63.4 Abnormal weight loss 01/05/2017 Yes J84.10 Pulmonary fibrosis, unspecified 01/05/2017 Yes Inactive Problems Resolved Problems Electronic Signature(s) Signed: 01/12/2017 5:13:56 PM By: [name] Ham MD Entered By: [name] [name] on 01/12/2017 14:11:06 [name] [name] ([id number]) -------------------------------------------------------------------------------- Progress Note Details Patient Name: [name] [name] Date of Service: 01/12/2017 12:30 PM Medical Record Number: [id number] Patient Account Number: [id number] Date of Birth/Sex: 1929-02-13 (83 y.o. Male) Treating [name]: [name] [name] Primary Care Provider: Emily [name] Other Clinician: Referring Provider: Emily [name] Treating Provider/Extender: [name] [name] in Treatment: 1 Subjective Chief Complaint Information obtained from Patient The patient is here for initial evaluation of his left trocanter pressure ulcer History of Present Illness (HPI) The following HPI elements were documented for the patient's wound: Location: left trocanter Quality: denies pain Duration: the ulcer was noticed two [name] ago Context: ulcer is secondary to pressure Modifying Factors: unrelieved pressure is an exacerbating factor 01/05/17- the patient is here for initial violation of a left trochanter pressure ulcer that was originally noticed by his wife a few [name] ago. He states that he traditionally sleeps on his left side. He has lost between 20 and 30 pounds of the past 6 months. He cannot articulate a specific reason for this he states that it's not a loss of appetite. He denies getting fatigued with eating,  despite having pulmonary fibrosis, COPD and being exertionally oxygen dependent. They have been applying neosporin. Lab work obtained in January revealed an albumin level 3.7. He is a non-diabetic, former smoker (quitting > 40 yrs ago) 01/12/17; left trochanter ulcer. smaller using iodosorb which was started last week. He is hypoxic. does not keep O2 on reliable at least here. When this happen P02 drops in the 70s Objective Constitutional Sitting or standing Blood Pressure is within target range for patient.. Pulse regular and within target range for patient.Marland Kitchen Respirations regular, non-labored and within target range.. Temperature is normal and within the target range for the patient.. Patient's appearance is neat and clean. Appears in no acute distress. Well nourished and well developed.. Vitals Time Taken: 12:43 PM, Height: 71 in, Weight: 143 lbs, BMI: 19.9, Temperature:  97.6 F, Pulse: 100 bpm, Respiratory Rate: 18 breaths/min, Blood Pressure: 138/68 mmHg. LANDRUM, CARBONELL ([id number]) Eyes Conjunctivae clear. No discharge.Marland Kitchen Respiratory course bilaterla crackles. no wheezing. Cardiovascular Heart rhythm and rate regular, without murmur or gallop. JVP not elevated. Pedal pulses palpable and strong bilaterally.Marland Kitchen Psychiatric Patient appears depressed today.. General Notes: Wound exam small open on left greater trochanter. surface is dry but still with some excar. No infection Integumentary (Hair, Skin) Wound #1 status is Open. Original cause of wound was Pressure Injury. The wound is located on the Left Trochanter. The wound measures 0.5cm length x 0.5cm width x 0.1cm depth; 0.196cm^2 area and 0.02cm^3 volume. The wound is limited to skin breakdown. There is no tunneling or undermining noted. There is a medium amount of serous drainage noted. The wound margin is flat and intact. There is no granulation within the wound bed. There is a large (67-100%) amount of necrotic tissue within  the wound bed including Adherent Slough. The periwound skin appearance did not exhibit: Callus, Crepitus, Excoriation, Induration, Rash, Scarring, Dry/Scaly, Maceration, Atrophie Blanche, Cyanosis, Ecchymosis, Hemosiderin Staining, Mottled, Pallor, Rubor, Erythema. Assessment Active Problems ICD-10 L89.220 - Pressure ulcer of left hip, unstageable R63.4 - Abnormal weight loss J84.10 - Pulmonary fibrosis, unspecified Plan Wound Cleansing: Wound #1 Left Trochanter: May Shower, gently pat wound dry prior to applying new dressing. TYDARIUS, YAWN ([id number]) Anesthetic: Wound #1 Left Trochanter: Topical Lidocaine 4% cream applied to wound bed prior to debridement - in clinic only Skin Barriers/Peri-Wound Care: Wound #1 Left Trochanter: Skin Prep Primary Wound Dressing: Wound #1 Left Trochanter: Iodosorb Ointment Secondary Dressing: Wound #1 Left Trochanter: Boardered Foam Dressing Dressing Change Frequency: Wound #1 Left Trochanter: Change dressing every other day. Follow-up Appointments: Wound #1 Left Trochanter: Return Appointment in 1 week. Off-Loading: Wound #1 Left Trochanter: Turn and reposition every 2 hours - Try to stay off of left hip. Additional Orders / Instructions: Wound #1 Left Trochanter: Other: - Do not leave home without Oxygen. 1 as the wound is smaller, continue the iodosorb 2 couincelled on pressure relief Electronic Signature(s) Signed: 01/12/2017 5:13:56 PM By: [name] Ham MD Entered By: [name] [name] on 01/12/2017 14:16:24 [name] [name] ([id number]) -------------------------------------------------------------------------------- SuperBill Details Patient Name: [name] [name] Date of Service: 01/12/2017 Medical Record Number: [id number] Patient Account Number: [id number] Date of Birth/Sex: 1929-08-23 (83 y.o. Male) Treating [name]: [name] [name] Primary Care Provider: Emily [name] Other Clinician: Referring Provider: Emily [name] Treating Provider/Extender: [name] [name] in Treatment: 1 Diagnosis Coding ICD-10 Codes Code Description [id number] Pressure ulcer of left hip, unstageable R63.4 Abnormal weight loss J84.10 Pulmonary fibrosis, unspecified Facility Procedures CPT4 Code: [id number] Description: [phone] - WOUND CARE VISIT-LEV 2 EST PT Modifier: Quantity: 1 Physician Procedures CPT4 Code: [id number] Description: 79432 - WC PHYS LEVEL 3 - EST PT ICD-10 Description Diagnosis L89.220 Pressure ulcer of left hip, unstageable R63.4 Abnormal weight loss J84.10 Pulmonary fibrosis, unspecified Modifier: Quantity: 1 Electronic Signature(s) Signed: 01/12/2017 5:13:56 PM By: [name] Ham MD Entered By: [name] [name] on 01/12/2017 14:16:51

## 2017-01-14 NOTE — Progress Notes (Signed)
[name] [name] ([id number]) Visit Report for 01/12/2017 Arrival Information Details Patient Name: [name] [name], [name] [name] Date of Service: 01/12/2017 12:30 PM Medical Record Number: [id number] Patient Account Number: [id number] Date of Birth/Sex: October 31, 1928 (83 y.o. Male) Treating RN: [name] [name] Primary Care Wynn Alldredge: [name] Other Clinician: Referring Daishia Fetterly: [name] Treating [place] Langstaff/Extender: Tito Dine in Treatment: 1 Visit Information History Since Last Visit Added or deleted any medications: No Patient Arrived: [name] [name] Any new allergies or adverse No Arrival Time: 12:42 reactions: Accompanied By: wife and Had a fall or experienced change in No caregiver activities of daily living that may Transfer Assistance: Manual affect Patient Identification Verified: Yes risk of falls: Secondary Verification Process Yes Signs or symptoms of abuse/neglect No Completed: since last visito Patient Requires Transmission- No Hospitalized since last visit: No Based Precautions: Has Dressing in Place as Yes Patient Has Alerts: Yes Prescribed: Patient Alerts: Patient on Blood Pain Present Now: Unable to Thinner Respond Warfarin NOT Diabetic Electronic Signature(s) Signed: 01/13/2017 10:32:11 AM By: [name], RN, BSN, [name] RN, BSN Entered By: [name], RN, BSN, [name] on 01/12/2017 12:43:22 [name] [name] ([id number]) -------------------------------------------------------------------------------- Clinic Level of Care Assessment Details Patient Name: [name] [name] Date of Service: 01/12/2017 12:30 PM Medical Record Number: [id number] Patient Account Number: [id number] Date of Birth/Sex: 31-Aug-1929 (83 y.o. Male) Treating RN: [name] [name] Primary Care Deina Lipsey: [name] Other Clinician: Referring Jayron Maqueda: [name] Treating Kyli Sorter/Extender: Tito Dine in Treatment: 1 Clinic Level of Care Assessment Items TOOL 4 Quantity Score _0  - Use  when only an EandM is performed on FOLLOW-UP visit 0 ASSESSMENTS - Nursing Assessment / Reassessment _1  - Reassessment of Co-morbidities (includes updates in patient status) 0 X - Reassessment of Adherence to Treatment Plan 1 5 ASSESSMENTS - Wound and Skin Assessment / Reassessment X - Simple Wound Assessment / Reassessment - one wound 1 5 _2  - Complex Wound Assessment / Reassessment - multiple wounds 0 _3  - Dermatologic / Skin Assessment (not related to wound area) 0 ASSESSMENTS - Focused Assessment _4  - Circumferential Edema Measurements - multi extremities 0 _5  - Nutritional Assessment / Counseling / Intervention 0 _6  - Lower Extremity Assessment (monofilament, tuning fork, pulses) 0 _7  - Peripheral Arterial Disease Assessment (using hand held doppler) 0 ASSESSMENTS - Ostomy and/or Continence Assessment and Care _8  - Incontinence Assessment and Management 0 _9  - Ostomy Care Assessment and Management (repouching, etc.) 0 PROCESS - Coordination of Care X - Simple Patient / Family Education for ongoing care 1 15 _10  - Complex (extensive) Patient / Family Education for ongoing care 0 _11  - Staff obtains Programmer, systems, Records, Test Results / Process Orders 0 _12  - Staff telephones HHA, Nursing Homes / Clarify orders / etc 0 _13  - Routine Transfer to another Facility (non-emergent condition) 0 [name] [name] ([id number]) _14  - Routine Hospital Admission (non-emergent condition) 0 _15  - New Admissions / Biomedical engineer / Ordering NPWT, Apligraf, etc. 0 _16  - Emergency Hospital Admission (emergent condition) 0 X - Simple Discharge Coordination 1 10 _17  - Complex (extensive) Discharge Coordination 0 PROCESS - Special Needs _18  - Pediatric / Minor Patient Management 0 _19  - Isolation Patient Management 0 _20  - Hearing / Language / Visual special needs 0 _21  - Assessment of Community assistance (transportation, D/C planning, etc.) 0 _22  - Additional assistance / Altered mentation 0 _23  - Support  Surface(s) Assessment (bed, cushion, seat, etc.) 0 INTERVENTIONS - Wound Cleansing / Measurement X - Simple Wound Cleansing - one wound 1 5 _24  - Complex  Wound Cleansing - multiple wounds 0 X - Wound Imaging (photographs - any number of wounds) 1 5 _0  - Wound Tracing (instead of photographs) 0 X - Simple Wound Measurement - one wound 1 5 _1  - Complex Wound Measurement - multiple wounds 0 INTERVENTIONS - Wound Dressings X - Small Wound Dressing one or multiple wounds 1 10 _2  - Medium Wound Dressing one or multiple wounds 0 _3  - Large Wound Dressing one or multiple wounds 0 <MWUXLKGMWNUUVOZD>_6<\/UYQIHKVQQVZDGLOV>_5  - Application of Medications - topical 0 <IEPPIRJJOACZYSAY>_3<\/KZSWFUXNATFTDDUK>_0  - Application of Medications - injection 0 INTERVENTIONS - Miscellaneous _6  - External ear exam 0 [name] [name] ([id number]) _7  - Specimen Collection (cultures, biopsies, blood, body fluids, etc.) 0 _8  - Specimen(s) / Culture(s) sent or taken to Lab for analysis 0 _9  - Patient Transfer (multiple staff / Civil Service fast streamer / Similar devices) 0 _10  - Simple Staple / Suture removal (25 or less) 0 _11  - Complex Staple / Suture removal (26 or more) 0 _12  - Hypo / Hyperglycemic Management (close monitor of Blood Glucose) 0 _13  - Ankle / Brachial Index (ABI) - do not check if billed separately 0 X - Vital Signs 1 5 Has the patient been seen at the hospital within the last three years: Yes Total Score: 65 Level Of Care: New/Established - Level 2 Electronic Signature(s) Signed: 01/13/2017 10:32:11 AM By: [name], RN, BSN, [name] RN, BSN Entered By: [name], RN, BSN, [name] on 01/12/2017 13:11:41 [name] [name] ([id number]) -------------------------------------------------------------------------------- Encounter Discharge Information Details Patient Name: [name] [name] Date of Service: 01/12/2017 12:30 PM Medical Record Number: [id number] Patient Account Number: [id number] Date of Birth/Sex: 1929/05/14 (83 y.o. Male) Treating RN: [name] [name] Primary Care Nikkia Devoss: [name] Other  Clinician: Referring Basel Defalco: [name] Treating Kayle Passarelli/Extender: Tito Dine in Treatment: 1 Encounter Discharge Information Items Schedule Follow-up Appointment: No Medication Reconciliation completed No and provided to Patient/Care Saree Krogh: Provided on Clinical Summary of Care: 01/12/2017 Form Type Recipient Paper Patient CG Electronic Signature(s) Signed: 01/12/2017 1:11:13 PM By: Ruthine Dose Entered By: Ruthine Dose on 01/12/2017 13:11:13 [name] [name] ([id number]) -------------------------------------------------------------------------------- Lower Extremity Assessment Details Patient Name: [name] [name] Date of Service: 01/12/2017 12:30 PM Medical Record Number: [id number] Patient Account Number: [id number] Date of Birth/Sex: [birth year] (83 y.o. Male) Treating RN: [name] [name] Primary Care Kimani Bedoya: [name] Other Clinician: Referring Jazen Spraggins: [name] Treating Sahand Gosch/Extender: Tito Dine in Treatment: 1 Electronic Signature(s) Signed: 01/13/2017 10:32:11 AM By: [name], RN, BSN, [name] RN, BSN Entered By: [name], RN, BSN, [name] on 01/12/2017 12:50:09 [name] [name] ([id number]) -------------------------------------------------------------------------------- Multi Wound Chart Details Patient Name: [name] [name] Date of Service: 01/12/2017 12:30 PM Medical Record Number: [id number] Patient Account Number: [id number] Date of Birth/Sex: November 30, 1928 (83 y.o. Male) Treating RN: [name] [name] Primary Care Nazario Russom: [name] Other Clinician: Referring Jud Fanguy: [name] Treating Teeghan Hammer/Extender: Tito Dine in Treatment: 1 Vital Signs Height(in): 71 Pulse(bpm): 100 Weight(lbs): 143 Blood Pressure 138/68 (mmHg): Body Mass Index(BMI): 20 Temperature(F): 97.6 Respiratory Rate 18 (breaths/min): Photos: [N/A:N/A] Wound Location: Left Trochanter N/A N/A Wounding Event: Pressure Injury N/A  N/A Primary Etiology: Pressure Ulcer N/A N/A Comorbid History: Cataracts, Chronic N/A N/A Obstructive Pulmonary Disease (COPD), Arrhythmia, Congestive Heart Failure, Coronary Artery Disease, Hypotension, Myocardial Infarction, Gout, Osteoarthritis Date Acquired: 12/22/2016 N/A N/A Weeks of Treatment: 1 N/A N/A Wound Status: Open N/A N/A Measurements L x W x D 0.5x0.5x0.1 N/A N/A (cm) Area (cm) : 0.196 N/A N/A Volume (cm) : 0.02 N/A N/A % Reduction in Area:  40.60% N/A N/A % Reduction in Volume: 39.40% N/A N/A Classification: Unstageable/Unclassified N/A N/A Exudate Amount: Medium N/A N/A Exudate Type: Serous N/A N/A Exudate Color: amber N/A N/A SULO, JANCZAK ([id number]) Wound Margin: Flat and Intact N/A N/A Granulation Amount: None Present (0%) N/A N/A Necrotic Amount: Large (67-100%) N/A N/A Exposed Structures: Fascia: No N/A N/A Fat Layer (Subcutaneous Tissue) Exposed: No Tendon: No Muscle: No Joint: No Bone: No Limited to Skin Breakdown Epithelialization: None N/A N/A Periwound Skin Texture: Excoriation: No N/A N/A Induration: No Callus: No Crepitus: No Rash: No Scarring: No Periwound Skin Maceration: No N/A N/A Moisture: Dry/Scaly: No Periwound Skin Color: Atrophie Blanche: No N/A N/A Cyanosis: No Ecchymosis: No Erythema: No Hemosiderin Staining: No Mottled: No Pallor: No Rubor: No Tenderness on No N/A N/A Palpation: Wound Preparation: Ulcer Cleansing: N/A N/A Rinsed/Irrigated with Saline Topical Anesthetic Applied: Other: lidocaine 4% Treatment Notes Electronic Signature(s) Signed: 01/12/2017 5:13:56 PM By: Linton Ham MD Entered By: Linton Ham on 01/12/2017 14:11:12 [name] [name] ([id number]) -------------------------------------------------------------------------------- [place] Details Patient Name: [name] [name] Date of Service: 01/12/2017 12:30 PM Medical Record Number:  [id number] Patient Account Number: [id number] Date of Birth/Sex: [birth year] (82 y.o. Male) Treating RN: [name] [name] Primary Care Katena Petitjean: [name] Other Clinician: Referring Tashay Bozich: [name] Treating Earlyn Sylvan/Extender: Tito Dine in Treatment: 1 Active Inactive ` Abuse / Safety / Falls / Self Care Management Nursing Diagnoses: Impaired physical mobility Potential for falls Goals: Patient/caregiver will verbalize understanding of skin care regimen Date Initiated: 01/05/2017 Target Resolution Date: 03/29/2017 Goal Status: Active Patient/caregiver will verbalize/demonstrate measures taken to prevent injury and/or falls Date Initiated: 01/05/2017 Target Resolution Date: 03/29/2017 Goal Status: Active Interventions: Assess fall risk on admission and as needed Assess self care needs on admission and as needed Notes: ` Orientation to the Wound Care Program Nursing Diagnoses: Knowledge deficit related to the wound healing center program Goals: Patient/caregiver will verbalize understanding of the [facility] Program Date Initiated: 01/05/2017 Target Resolution Date: 03/29/2017 Goal Status: Active Interventions: Provide education on orientation to the wound center Notes: SHAWNTEZ, DICKISON ([id number]) Pressure Nursing Diagnoses: Knowledge deficit related to management of pressures ulcers Goals: Patient will remain free of pressure ulcers Date Initiated: 01/05/2017 Target Resolution Date: 03/29/2017 Goal Status: Active Patient/caregiver will verbalize understanding of pressure ulcer management Date Initiated: 01/05/2017 Target Resolution Date: 03/29/2017 Goal Status: Active Interventions: Assess offloading mechanisms upon admission and as needed Notes: ` Wound/Skin Impairment Nursing Diagnoses: Knowledge deficit related to ulceration/compromised skin integrity Goals: Ulcer/skin breakdown will heal within 14 weeks Date Initiated: 01/05/2017 Target  Resolution Date: 04/06/2017 Goal Status: Active Interventions: Assess patient/caregiver ability to perform ulcer/skin care regimen upon admission and as needed Assess ulceration(s) every visit Treatment Activities: Skin care regimen initiated : 01/05/2017 Topical wound management initiated : 01/05/2017 Notes: Electronic Signature(s) Signed: 01/13/2017 10:32:11 AM By: [name], RN, BSN, [name] RN, BSN Entered By: [name], RN, BSN, [name] on 01/12/2017 12:51:47 [name] [name] ([id number]) -------------------------------------------------------------------------------- Pain Assessment Details Patient Name: [name] [name] Date of Service: 01/12/2017 12:30 PM Medical Record Number: [id number] Patient Account Number: [id number] Date of Birth/Sex: Feb 06, 1929 (83 y.o. Male) Treating RN: [name] [name] Primary Care Layten Aiken: [name] Other Clinician: Referring Yesli Vanderhoff: [name] Treating Rayquan Amrhein/Extender: Tito Dine in Treatment: 1 Active Problems Location of Pain Severity and Description of Pain Patient Has Paino No Site Locations With Dressing Change: No Pain Management and Medication Current Pain Management: Electronic Signature(s) Signed: 01/13/2017 10:32:11 AM By: [name], RN, BSN,  Maudie Mercury RN, BSN Entered By: [name], RN, BSN, [name] on 01/12/2017 12:43:30 [name] [name] ([id number]) -------------------------------------------------------------------------------- Wound Assessment Details Patient Name: [name] [name] Date of Service: 01/12/2017 12:30 PM Medical Record Number: [id number] Patient Account Number: [id number] Date of Birth/Sex: 1929-04-21 (83 y.o. Male) Treating RN: [name] [name] Primary Care Adriana Quinby: [name] Other Clinician: Referring Daleon Willinger: [name] Treating Valyn Latchford/Extender: Tito Dine in Treatment: 1 Wound Status Wound Number: 1 Primary Pressure Ulcer Etiology: Wound Location: Left Trochanter Wound Open Wounding Event: Pressure  Injury Status: Date Acquired: 12/22/2016 Comorbid Cataracts, Chronic Obstructive Weeks Of Treatment: 1 History: Pulmonary Disease (COPD), Arrhythmia, Clustered Wound: No Congestive Heart Failure, Coronary Artery Disease, Hypotension, Myocardial Infarction, Gout, Osteoarthritis Photos Wound Measurements Length: (cm) 0.5 Width: (cm) 0.5 Depth: (cm) 0.1 Area: (cm) 0.196 Volume: (cm) 0.02 % Reduction in Area: 40.6% % Reduction in Volume: 39.4% Epithelialization: None Tunneling: No Undermining: No Wound Description Classification: Unstageable/Unclassified Wound Margin: Flat and Intact Exudate Amount: Medium Exudate Type: Serous Exudate Color: amber Foul Odor After Cleansing: No Slough/Fibrino Yes Wound Bed Granulation Amount: None Present (0%) Exposed Structure Necrotic Amount: Large (67-100%) Fascia Exposed: No Necrotic Quality: Adherent Slough Fat Layer (Subcutaneous Tissue) Exposed: No Tendon Exposed: No Muscle Exposed: No BYRNE, CAPEK ([id number]) Joint Exposed: No Bone Exposed: No Limited to Skin Breakdown Periwound Skin Texture Texture Color No Abnormalities Noted: No No Abnormalities Noted: No Callus: No Atrophie Blanche: No Crepitus: No Cyanosis: No Excoriation: No Ecchymosis: No Induration: No Erythema: No Rash: No Hemosiderin Staining: No Scarring: No Mottled: No Pallor: No Moisture Rubor: No No Abnormalities Noted: No Dry / Scaly: No Maceration: No Wound Preparation Ulcer Cleansing: Rinsed/Irrigated with Saline Topical Anesthetic Applied: Other: lidocaine 4%, Electronic Signature(s) Signed: 01/13/2017 10:32:11 AM By: [name], RN, BSN, [name] RN, BSN Entered By: [name], RN, BSN, [name] on 01/12/2017 12:49:49 [name] [name] ([id number]) -------------------------------------------------------------------------------- [facility] Details Patient Name: [name] [name] Date of Service: 01/12/2017 12:30 PM Medical Record Number:  [id number] Patient Account Number: [id number] Date of Birth/Sex: 12-May-1929 (83 y.o. Male) Treating RN: [name] [name] Primary Care Savion Washam: [name] Other Clinician: Referring Aaniya Sterba: [name] Treating Layken Beg/Extender: Tito Dine in Treatment: 1 Vital Signs Time Taken: 12:43 Temperature (F): 97.6 Height (in): 71 Pulse (bpm): 100 Weight (lbs): 143 Respiratory Rate (breaths/min): 18 Body Mass Index (BMI): 19.9 Blood Pressure (mmHg): 138/68 Reference Range: 80 - 120 mg / dl Electronic Signature(s) Signed: 01/13/2017 10:32:11 AM By: [name], RN, BSN, [name] RN, BSN Entered By: [name], RN, BSN, [name] on 01/12/2017 12:45:12

## 2017-01-19 ENCOUNTER — Encounter: Payer: Medicare Other | Admitting: Internal Medicine

## 2017-01-19 DIAGNOSIS — L89220 Pressure ulcer of left hip, unstageable: Secondary | ICD-10-CM | POA: Diagnosis not present

## 2017-01-21 NOTE — Progress Notes (Signed)
TRAYCEN, GOYER ([id number]) Visit Report for 01/19/2017 Arrival Information Details Patient Name: [name] [name], [name] [name] Date of Service: 01/19/2017 12:30 PM Medical Record Number: [id number] Patient Account Number: [id number] Date of Birth/Sex: [birth year] (83 y.o. Male) Treating RN: [name] Primary Care Massiah Longanecker: [name] Other Clinician: Referring Virtie Bungert: [name] Treating Zhane Bluitt/Extender: Tito Dine in Treatment: 2 Visit Information History Since Last Visit Added or deleted any medications: No Patient Arrived: Wheel Chair Any new allergies or adverse reactions: No Arrival Time: 12:37 Had a fall or experienced change in No Accompanied By: wife activities of daily living that may affect Transfer Assistance: None risk of falls: Patient Identification Verified: Yes Signs or symptoms of abuse/neglect since last No Secondary Verification Process Yes visito Completed: Hospitalized since last visit: No Patient Requires Transmission- No Has Dressing in Place as Prescribed: Yes Based Precautions: Pain Present Now: No Patient Has Alerts: Yes Patient Alerts: Patient on Blood Thinner Warfarin NOT Diabetic Electronic Signature(s) Signed: 01/20/2017 11:27:26 AM By: [name], RN, BSN, [name] RN, BSN Entered By: [name], RN, BSN, [name] on 01/19/2017 12:37:41 [name] [name] ([id number]) -------------------------------------------------------------------------------- Clinic Level of Care Assessment Details Patient Name: [name] [name] Date of Service: 01/19/2017 12:30 PM Medical Record Number: [id number] Patient Account Number: [id number] Date of Birth/Sex: 14-Nov-1928 (83 y.o. Male) Treating RN: [name] Primary Care Martel Galvan: [name] Other Clinician: Referring Viktoriya Glaspy: [name] Treating Vadie Principato/Extender: Tito Dine in Treatment: 2 Clinic Level of Care Assessment Items TOOL 4 Quantity Score _0  - Use when only an EandM is performed  on FOLLOW-UP visit 0 ASSESSMENTS - Nursing Assessment / Reassessment _1  - Reassessment of Co-morbidities (includes updates in patient status) 0 X - Reassessment of Adherence to Treatment Plan 1 5 ASSESSMENTS - Wound and Skin Assessment / Reassessment X - Simple Wound Assessment / Reassessment - one wound 1 5 _2  - Complex Wound Assessment / Reassessment - multiple wounds 0 _3  - Dermatologic / Skin Assessment (not related to wound area) 0 ASSESSMENTS - Focused Assessment _4  - Circumferential Edema Measurements - multi extremities 0 _5  - Nutritional Assessment / Counseling / Intervention 0 _6  - Lower Extremity Assessment (monofilament, tuning fork, pulses) 0 _7  - Peripheral Arterial Disease Assessment (using hand held doppler) 0 ASSESSMENTS - Ostomy and/or Continence Assessment and Care _8  - Incontinence Assessment and Management 0 _9  - Ostomy Care Assessment and Management (repouching, etc.) 0 PROCESS - Coordination of Care X - Simple Patient / Family Education for ongoing care 1 15 _10  - Complex (extensive) Patient / Family Education for ongoing care 0 _11  - Staff obtains Programmer, systems, Records, Test Results / Process Orders 0 _12  - Staff telephones HHA, Nursing Homes / Clarify orders / etc 0 _13  - Routine Transfer to another Facility (non-emergent condition) 0 LAINE, GIOVANETTI ([id number]) _14  - Routine Hospital Admission (non-emergent condition) 0 _15  - New Admissions / Biomedical engineer / Ordering NPWT, Apligraf, etc. 0 _16  - Emergency Hospital Admission (emergent condition) 0 X - Simple Discharge Coordination 1 10 _17  - Complex (extensive) Discharge Coordination 0 PROCESS - Special Needs _18  - Pediatric / Minor Patient Management 0 _19  - Isolation Patient Management 0 _20  - Hearing / Language / Visual special needs 0 _21  - Assessment of Community assistance (transportation, D/C planning, etc.) 0 _22  - Additional assistance / Altered mentation 0 _23  - Support Surface(s) Assessment (bed,  cushion, seat, etc.) 0 INTERVENTIONS - Wound Cleansing / Measurement X - Simple Wound Cleansing - one wound 1 5 _24  - Complex Wound Cleansing -  multiple wounds 0 X - Wound Imaging (photographs - any number of wounds) 1 5 _0  - Wound Tracing (instead of photographs) 0 X - Simple Wound Measurement - one wound 1 5 _1  - Complex Wound Measurement - multiple wounds 0 INTERVENTIONS - Wound Dressings X - Small Wound Dressing one or multiple wounds 1 10 _2  - Medium Wound Dressing one or multiple wounds 0 _3  - Large Wound Dressing one or multiple wounds 0 <YQMVHQIONGEXBMWU>_1<\/LKGMWNUUVOZDGUYQ>_0  - Application of Medications - topical 0 <HKVQQVZDGLOVFIEP>_3<\/IRJJOACZYSAYTKZS>_0  - Application of Medications - injection 0 INTERVENTIONS - Miscellaneous _6  - External ear exam 0 JAYMOND, WAAGE ([id number]) _7  - Specimen Collection (cultures, biopsies, blood, body fluids, etc.) 0 _8  - Specimen(s) / Culture(s) sent or taken to Lab for analysis 0 _9  - Patient Transfer (multiple staff / Civil Service fast streamer / Similar devices) 0 _10  - Simple Staple / Suture removal (25 or less) 0 _11  - Complex Staple / Suture removal (26 or more) 0 _12  - Hypo / Hyperglycemic Management (close monitor of Blood Glucose) 0 _13  - Ankle / Brachial Index (ABI) - do not check if billed separately 0 X - Vital Signs 1 5 Has the patient been seen at the hospital within the last three years: Yes Total Score: 65 Level Of Care: New/Established - Level 2 Electronic Signature(s) Signed: 01/20/2017 11:27:26 AM By: [name], RN, BSN, [name] RN, BSN Entered By: [name], RN, BSN, [name] on 01/19/2017 12:57:14 [name] [name] ([id number]) -------------------------------------------------------------------------------- Encounter Discharge Information Details Patient Name: [name] [name] Date of Service: 01/19/2017 12:30 PM Medical Record Number: [id number] Patient Account Number: [id number] Date of Birth/Sex: 1929-07-07 (83 y.o. Male) Treating RN: [name] Primary Care Tramane Gorum: [name] Other Clinician: Referring  Ebin Palazzi: [name] Treating Rosene Pilling/Extender: Tito Dine in Treatment: 2 Encounter Discharge Information Items Discharge Pain Level: 0 Discharge Condition: Stable Ambulatory Status: Wheelchair Discharge Destination: Home Transportation: Private Auto Accompanied By: wife Schedule Follow-up Appointment: Yes Medication Reconciliation completed and provided to Patient/Care Yes [place]: Provided on Clinical Summary of Care: 01/19/2017 Form Type Recipient Paper Patient CG Electronic Signature(s) Signed: 01/20/2017 11:27:26 AM By: [name] RN, BSN, [name] RN, BSN Previous Signature: 01/19/2017 12:57:10 PM Version By: Ruthine Dose Entered By: [name] RN, BSN, [name] on 01/19/2017 12:58:09 [name] [name] ([id number]) -------------------------------------------------------------------------------- Lower Extremity Assessment Details Patient Name: [name] [name] Date of Service: 01/19/2017 12:30 PM Medical Record Number: [id number] Patient Account Number: [id number] Date of Birth/Sex: [birth year] (83 y.o. Male) Treating RN: [name] Primary Care Markie Heffernan: [name] Other Clinician: Referring Krystianna Soth: [name] Treating Obaloluwa Delatte/Extender: Tito Dine in Treatment: 2 Electronic Signature(s) Signed: 01/20/2017 11:27:26 AM By: [name], RN, BSN, [name] RN, BSN Entered By: [name], RN, BSN, [name] on 01/19/2017 12:45:09 [name] [name] ([id number]) -------------------------------------------------------------------------------- Multi Wound Chart Details Patient Name: [name] [name] Date of Service: 01/19/2017 12:30 PM Medical Record Number: [id number] Patient Account Number: [id number] Date of Birth/Sex: October 09, 1928 (83 y.o. Male) Treating RN: [name] Primary Care Danaisha Celli: [name] Other Clinician: Referring Huck Ashworth: [name] Treating Eliora Nienhuis/Extender: Tito Dine in Treatment: 2 Vital Signs Height(in): 71 Pulse(bpm):  98 Weight(lbs): 143 Blood Pressure 98/68 (mmHg): Body Mass Index(BMI): 20 Temperature(F): Respiratory Rate 18 (breaths/min): Photos: [1:No Photos] [N/A:N/A] Wound Location: [1:Left Trochanter] [N/A:N/A] Wounding Event: [1:Pressure Injury] [N/A:N/A] Primary Etiology: [1:Pressure Ulcer] [N/A:N/A] Comorbid History: [1:Cataracts, Chronic Obstructive Pulmonary Disease (COPD), Arrhythmia, Congestive Heart Failure, Coronary Artery Disease, Hypotension, Myocardial Infarction, Gout, Osteoarthritis] [N/A:N/A] Date Acquired: [1:12/22/2016] [N/A:N/A] Weeks of Treatment: [1:2] [N/A:N/A] Wound Status: [1:Open] [N/A:N/A] Measurements  L x W x D 0.2x0.3x0.1 [N/A:N/A] (cm) Area (cm) : [1:0.047] [N/A:N/A] Volume (cm) : [1:0.005] [N/A:N/A] % Reduction in Area: [1:85.80%] [N/A:N/A] % Reduction in Volume: 84.80% [N/A:N/A] Classification: [1:Unstageable/Unclassified] [N/A:N/A] Exudate Amount: [1:Medium] [N/A:N/A] Exudate Type: [1:Serous] [N/A:N/A] Exudate Color: [1:amber] [N/A:N/A] Wound Margin: [1:Flat and Intact] [N/A:N/A] Granulation Amount: [1:None Present (0%)] [N/A:N/A] Necrotic Amount: [1:Large (67-100%)] [N/A:N/A] Exposed Structures: [N/A:N/A] Fascia: No Fat Layer (Subcutaneous Tissue) Exposed: No Tendon: No Muscle: No Joint: No Bone: No Limited to Skin Breakdown Epithelialization: None N/A N/A Periwound Skin Texture: Excoriation: No N/A N/A Induration: No Callus: No Crepitus: No Rash: No Scarring: No Periwound Skin Maceration: No N/A N/A Moisture: Dry/Scaly: No Periwound Skin Color: Atrophie Blanche: No N/A N/A Cyanosis: No Ecchymosis: No Erythema: No Hemosiderin Staining: No Mottled: No Pallor: No Rubor: No Tenderness on No N/A N/A Palpation: Wound Preparation: Ulcer Cleansing: N/A N/A Rinsed/Irrigated with Saline Topical Anesthetic Applied: Other: lidocaine 4% Treatment Notes Electronic Signature(s) Signed: 01/20/2017 11:27:26 AM By: [name], RN, BSN, [name]  RN, BSN Entered By: [name], RN, BSN, [name] on 01/19/2017 12:56:29 [name] [name] ([id number]) -------------------------------------------------------------------------------- [place] Details Patient Name: MUMIN, DENOMME Date of Service: 01/19/2017 12:30 PM Medical Record Number: [id number] Patient Account Number: [id number] Date of Birth/Sex: 22-Jan-1929 (83 y.o. Male) Treating RN: [name] Primary Care Payslie Mccaig: [name] Other Clinician: Referring Durwood Dittus: [name] Treating Drisana Schweickert/Extender: Tito Dine in Treatment: 2 Active Inactive ` Abuse / Safety / Falls / Self Care Management Nursing Diagnoses: Impaired physical mobility Potential for falls Goals: Patient/caregiver will verbalize understanding of skin care regimen Date Initiated: 01/05/2017 Target Resolution Date: 03/29/2017 Goal Status: Active Patient/caregiver will verbalize/demonstrate measures taken to prevent injury and/or falls Date Initiated: 01/05/2017 Target Resolution Date: 03/29/2017 Goal Status: Active Interventions: Assess fall risk on admission and as needed Assess self care needs on admission and as needed Notes: ` Orientation to the Wound Care Program Nursing Diagnoses: Knowledge deficit related to the wound healing center program Goals: Patient/caregiver will verbalize understanding of the [place] Program Date Initiated: 01/05/2017 Target Resolution Date: 03/29/2017 Goal Status: Active Interventions: Provide education on orientation to the wound center Notes: BRYSON, GAVIA ([id number]) Pressure Nursing Diagnoses: Knowledge deficit related to management of pressures ulcers Goals: Patient will remain free of pressure ulcers Date Initiated: 01/05/2017 Target Resolution Date: 03/29/2017 Goal Status: Active Patient/caregiver will verbalize understanding of pressure ulcer management Date Initiated: 01/05/2017 Target Resolution Date:  03/29/2017 Goal Status: Active Interventions: Assess offloading mechanisms upon admission and as needed Notes: ` Wound/Skin Impairment Nursing Diagnoses: Knowledge deficit related to ulceration/compromised skin integrity Goals: Ulcer/skin breakdown will heal within 14 weeks Date Initiated: 01/05/2017 Target Resolution Date: 04/06/2017 Goal Status: Active Interventions: Assess patient/caregiver ability to perform ulcer/skin care regimen upon admission and as needed Assess ulceration(s) every visit Treatment Activities: Skin care regimen initiated : 01/05/2017 Topical wound management initiated : 01/05/2017 Notes: Electronic Signature(s) Signed: 01/20/2017 11:27:26 AM By: [name], RN, BSN, [name] RN, BSN Entered By: [name], RN, BSN, [name] on 01/19/2017 12:56:22 [name] [name] ([id number]) -------------------------------------------------------------------------------- Pain Assessment Details Patient Name: [name] [name] Date of Service: 01/19/2017 12:30 PM Medical Record Number: [id number] Patient Account Number: [id number] Date of Birth/Sex: [birth year] (83 y.o. Male) Treating RN: [name] Primary Care Juleah Paradise: [name] Other Clinician: Referring Amareon Phung: [name] Treating Shaianne Nucci/Extender: Tito Dine in Treatment: 2 Active Problems Location of Pain Severity and Description of Pain Patient Has Paino No Site Locations With Dressing Change: No Pain Management and Medication  Current Pain Management: Electronic Signature(s) Signed: 01/20/2017 11:27:26 AM By: [name], RN, BSN, [name] RN, BSN Entered By: [name], RN, BSN, [name] on 01/19/2017 12:37:51 [name] [name] ([id number]) -------------------------------------------------------------------------------- Patient/Caregiver Education Details Patient Name: [name] [name] Date of Service: 01/19/2017 12:30 PM Medical Record Number: [id number] Patient Account Number: [id number] Date of Birth/Gender: 04-05-1929 (83  y.o. Male) Treating RN: [name] Primary Care Physician: [name] Other Clinician: Referring Physician: [name] Treating Physician/Extender: Tito Dine in Treatment: 2 Education Assessment Education Provided To: Patient Education Topics Provided Pressure: Handouts: Pressure Ulcers: Care and Offloading, Preventing Pressure Ulcers Methods: Demonstration, Explain/Verbal Responses: State content correctly Electronic Signature(s) Signed: 01/20/2017 11:27:26 AM By: [name], RN, BSN, [name] RN, BSN Entered By: [name], RN, BSN, [name] on 01/19/2017 12:58:22 [name] [name] ([id number]) -------------------------------------------------------------------------------- Wound Assessment Details Patient Name: [name] [name] Date of Service: 01/19/2017 12:30 PM Medical Record Number: [id number] Patient Account Number: [id number] Date of Birth/Sex: December 12, 1928 (83 y.o. Male) Treating RN: [name] Primary Care Mikyah Alamo: [name] Other Clinician: Referring Jonne Rote: [name] Treating Tayvia Faughnan/Extender: Tito Dine in Treatment: 2 Wound Status Wound Number: 1 Primary Pressure Ulcer Etiology: Wound Location: Left Trochanter Wound Open Wounding Event: Pressure Injury Status: Date Acquired: 12/22/2016 Comorbid Cataracts, Chronic Obstructive Weeks Of Treatment: 2 History: Pulmonary Disease (COPD), Arrhythmia, Clustered Wound: No Congestive Heart Failure, Coronary Artery Disease, Hypotension, Myocardial Infarction, Gout, Osteoarthritis Photos Photo Uploaded By: [name], RN, BSN, [name] on 01/19/2017 14:45:41 Wound Measurements Length: (cm) 0.2 Width: (cm) 0.3 Depth: (cm) 0.1 Area: (cm) 0.047 Volume: (cm) 0.005 % Reduction in Area: 85.8% % Reduction in Volume: 84.8% Epithelialization: None Tunneling: No Undermining: No Wound Description Classification: Unstageable/Unclassified Wound Margin: Flat and Intact Exudate Amount: Medium Exudate  Type: Serous Exudate Color: amber Foul Odor After Cleansing: No Slough/Fibrino Yes Wound Bed Granulation Amount: None Present (0%) Exposed Structure Necrotic Amount: Large (67-100%) Fascia Exposed: No Necrotic Quality: Adherent Slough Fat Layer (Subcutaneous Tissue) Exposed: No Tendon Exposed: No DRAYCE, TAWIL ([id number]) Muscle Exposed: No Joint Exposed: No Bone Exposed: No Limited to Skin Breakdown Periwound Skin Texture Texture Color No Abnormalities Noted: No No Abnormalities Noted: No Callus: No Atrophie Blanche: No Crepitus: No Cyanosis: No Excoriation: No Ecchymosis: No Induration: No Erythema: No Rash: No Hemosiderin Staining: No Scarring: No Mottled: No Pallor: No Moisture Rubor: No No Abnormalities Noted: No Dry / Scaly: No Maceration: No Wound Preparation Ulcer Cleansing: Rinsed/Irrigated with Saline Topical Anesthetic Applied: Other: lidocaine 4%, Treatment Notes Wound #1 (Left Trochanter) 1. Cleansed with: Clean wound with Normal Saline May Shower, gently pat wound dry prior to applying new dressing. 2. Anesthetic Topical Lidocaine 4% cream to wound bed prior to debridement 4. Dressing Applied: Iodosorb Ointment 5. Secondary Dressing Applied Bordered Foam Dressing Electronic Signature(s) Signed: 01/20/2017 11:27:26 AM By: [name], RN, BSN, [name] RN, BSN Entered By: [name], RN, BSN, [name] on 01/19/2017 12:44:57 [name] [name] ([id number]) -------------------------------------------------------------------------------- [place] Details Patient Name: [name] [name] Date of Service: 01/19/2017 12:30 PM Medical Record Number: [id number] Patient Account Number: [id number] Date of Birth/Sex: [birth year] (83 y.o. Male) Treating RN: [name] Primary Care Rayah Fines: [name] Other Clinician: Referring Donnika Kucher: [name] Treating Viraj Liby/Extender: Tito Dine in Treatment: 2 Vital Signs Time Taken: 12:37 Pulse (bpm):  98 Height (in): 71 Respiratory Rate (breaths/min): 18 Weight (lbs): 143 Blood Pressure (mmHg): 98/68 Body Mass Index (BMI): 19.9 Reference Range: 80 - 120 mg / dl Pulse Oximetry (%): 95 Notes Patient is on 4L O2. Electronic Signature(s) Signed:  01/20/2017 11:27:26 AM By: [name], RN, BSN, [name] RN, BSN Entered By: [name], RN, BSN, [name] on 01/19/2017 12:47:40

## 2017-01-21 NOTE — Progress Notes (Signed)
EAIN, MULLENDORE ([id number]) Visit Report for 01/19/2017 Chief Complaint Document Details Patient Name: [name] [name], [name] [name] Date of Service: 01/19/2017 12:30 PM Medical Record Number: [id number] Patient Account Number: [id number] Date of Birth/Sex: Oct 07, 1928 (83 y.o. Male) Treating RN: [name] Primary Care Provider: [name] Other Clinician: Referring Provider: [name] Treating Provider/Extender: [name] [name] in Treatment: 2 Information Obtained from: Patient Chief Complaint The patient is here for initial evaluation of his left trocanter pressure ulcer Electronic Signature(s) Signed: 01/19/2017 5:12:22 PM By: Linton Ham MD Entered By: Linton Ham on 01/19/2017 12:50:36 [name] [name] ([id number]) -------------------------------------------------------------------------------- HPI Details Patient Name: [name] [name] Date of Service: 01/19/2017 12:30 PM Medical Record Number: [id number] Patient Account Number: [id number] Date of Birth/Sex: Apr 14, 1929 (83 y.o. Male) Treating RN: [name] Primary Care Provider: [name] Other Clinician: Referring Provider: [name] Treating Provider/Extender: [name] [name] in Treatment: 2 History of Present Illness Location: left trocanter Quality: denies pain Duration: the ulcer was noticed two weeks ago Context: ulcer is secondary to pressure Modifying Factors: unrelieved pressure is an exacerbating factor HPI Description: 01/05/17- the patient is here for initial violation of a left trochanter pressure ulcer that was originally noticed by his wife a few weeks ago. He states that he traditionally sleeps on his left side. He has lost between 20 and 30 pounds of the past 6 months. He cannot articulate a specific reason for this he states that it's not a loss of appetite. He denies getting fatigued with eating, despite having pulmonary fibrosis, COPD and being exertionally oxygen dependent.  They have been applying neosporin. Lab work obtained in January revealed an albumin level 3.7. He is a non-diabetic, former smoker (quitting > 40 yrs ago) 01/12/17; left trochanter ulcer. smaller using iodosorb which was started last week. He is hypoxic. does not keep O2 on reliable at least here. When this happen P02 drops in the 70s 01/19/17; left trochanteric ulcer using Iodosorb about half the size of last week. O2 sat is 96% on 2 L respiratory status seems more stable Electronic Signature(s) Signed: 01/19/2017 5:12:22 PM By: Linton Ham MD Entered By: Linton Ham on 01/19/2017 12:51:12 [name] [name] ([id number]) -------------------------------------------------------------------------------- Physical Exam Details Patient Name: [name] [name] Date of Service: 01/19/2017 12:30 PM Medical Record Number: [id number] Patient Account Number: [id number] Date of Birth/Sex: Jul 30, 1929 (83 y.o. Male) Treating RN: [name] Primary Care Provider: [name] Other Clinician: Referring Provider: [name] Treating Provider/Extender: [name] [name] in Treatment: 2 Constitutional Patient is hypertensive.. Pulse regular and within target range for patient.Marland Kitchen Respirations regular, non-labored and within target range.. Temperature is normal and within the target range for the patient.. No distress. Respiratory Respiratory effort is easy and symmetric bilaterally. Rate is normal at rest and on room air.. Slightly shallow air entry but otherwise surprisingly clear. Cardiovascular Heart rhythm and rate regular, without murmur or gallop.. Edema present in both extremities. This is minimal. Gastrointestinal (GI) Abdomen is soft and non-distended without masses or tenderness. Bowel sounds active in all quadrants.. No liver or spleen enlargement or tenderness.. Integumentary (Hair, Skin) No rash the patient does have chronic venous insufficiency but has no wound history  here. Psychiatric No evidence of depression, anxiety, or agitation. Calm, cooperative, and communicative. Appropriate interactions and affect.. Notes Wound exam; small open area on the left greater trochanter surface appears dry however the dimensions are better. Still some eschar although it appears to be closing without any debridement necessary Electronic Signature(s) Signed: 01/19/2017 5:12:22  PM By: Linton Ham MD Entered By: Linton Ham on 01/19/2017 12:52:38 [name] [name] ([id number]) -------------------------------------------------------------------------------- Physician Orders Details Patient Name: [name] [name] Date of Service: 01/19/2017 12:30 PM Medical Record Number: [id number] Patient Account Number: [id number] Date of Birth/Sex: 1928-12-14 (83 y.o. Male) Treating RN: [name] Primary Care Provider: [name] Other Clinician: Referring Provider: [name] Treating Provider/Extender: [name] [name] in Treatment: 2 Verbal / Phone Orders: No Diagnosis Coding ICD-10 Coding Code Description [phone] Pressure ulcer of left hip, unstageable R63.4 Abnormal weight loss J84.10 Pulmonary fibrosis, unspecified Wound Cleansing Wound #1 Left Trochanter o May Shower, gently pat wound dry prior to applying new dressing. Anesthetic Wound #1 Left Trochanter o Topical Lidocaine 4% cream applied to wound bed prior to debridement - in clinic only Skin Barriers/Peri-Wound Care Wound #1 Left Trochanter o Skin Prep Primary Wound Dressing Wound #1 Left Trochanter o Iodosorb Ointment Secondary Dressing Wound #1 Left Trochanter o Boardered Foam Dressing Dressing Change Frequency Wound #1 Left Trochanter o Change dressing every other day. Follow-up Appointments Wound #1 Left Trochanter o Return Appointment in 1 week. [name] [name], [name] [name] ([id number]) Off-Loading Wound #1 Left Trochanter o Turn and reposition every 2 hours - Try to  stay off of left hip. Additional Orders / Instructions Wound #1 Left Trochanter o Other: - Do not leave home without Oxygen. Electronic Signature(s) Signed: 01/19/2017 5:12:22 PM By: Linton Ham MD Signed: 01/20/2017 11:27:26 AM By: [name] RN, BSN, [name] RN, BSN Entered By: [name], RN, BSN, [name] on 01/19/2017 12:56:48 [name] [name] ([id number]) -------------------------------------------------------------------------------- Problem List Details Patient Name: [name] [name], [name] [name] Date of Service: 01/19/2017 12:30 PM Medical Record Number: [id number] Patient Account Number: [id number] Date of Birth/Sex: 07-Jul-1929 (83 y.o. Male) Treating RN: [name] Primary Care Provider: [name] Other Clinician: Referring Provider: [name] Treating Provider/Extender: Ricard Dillon Weeks in Treatment: 2 Active Problems ICD-10 Encounter Code Description Active Date Diagnosis L89.220 Pressure ulcer of left hip, unstageable 01/05/2017 Yes R63.4 Abnormal weight loss 01/05/2017 Yes J84.10 Pulmonary fibrosis, unspecified 01/05/2017 Yes Inactive Problems Resolved Problems Electronic Signature(s) Signed: 01/19/2017 5:12:22 PM By: Linton Ham MD Entered By: Linton Ham on 01/19/2017 12:50:07 [name] [name] ([id number]) -------------------------------------------------------------------------------- Progress Note Details Patient Name: [name] [name] Date of Service: 01/19/2017 12:30 PM Medical Record Number: [id number] Patient Account Number: [id number] Date of Birth/Sex: 1929/07/24 (83 y.o. Male) Treating RN: [name] Primary Care Provider: [name] Other Clinician: Referring Provider: [name] Treating Provider/Extender: [name] [name] in Treatment: 2 Subjective Chief Complaint Information obtained from Patient The patient is here for initial evaluation of his left trocanter pressure ulcer History of Present Illness (HPI) The following HPI elements  were documented for the patient's wound: Location: left trocanter Quality: denies pain Duration: the ulcer was noticed two weeks ago Context: ulcer is secondary to pressure Modifying Factors: unrelieved pressure is an exacerbating factor 01/05/17- the patient is here for initial violation of a left trochanter pressure ulcer that was originally noticed by his wife a few weeks ago. He states that he traditionally sleeps on his left side. He has lost between 20 and 30 pounds of the past 6 months. He cannot articulate a specific reason for this he states that it's not a loss of appetite. He denies getting fatigued with eating, despite having pulmonary fibrosis, COPD and being exertionally oxygen dependent. They have been applying neosporin. Lab work obtained in January revealed an albumin level 3.7. He is a non-diabetic, former smoker (quitting > 40 yrs ago)  01/12/17; left trochanter ulcer. smaller using iodosorb which was started last week. He is hypoxic. does not keep O2 on reliable at least here. When this happen P02 drops in the 70s 01/19/17; left trochanteric ulcer using Iodosorb about half the size of last week. O2 sat is 96% on 2 L respiratory status seems more stable Objective Constitutional Patient is hypertensive.. Pulse regular and within target range for patient.Marland Kitchen Respirations regular, non-labored and within target range.. Temperature is normal and within the target range for the patient.. No distress. Vitals Time Taken: 12:37 PM, Height: 71 in, Weight: 143 lbs, BMI: 19.9, Pulse: 98 bpm, Respiratory Rate: 18 breaths/min, Blood Pressure: 98/68 mmHg, Pulse Oximetry: 95 %. General Notes: Patient is on 4L O2. [name] [name], [name] [name] ([id number]) Respiratory Respiratory effort is easy and symmetric bilaterally. Rate is normal at rest and on room air.. Slightly shallow air entry but otherwise surprisingly clear. Cardiovascular Heart rhythm and rate regular, without murmur or gallop.. Edema  present in both extremities. This is minimal. Gastrointestinal (GI) Abdomen is soft and non-distended without masses or tenderness. Bowel sounds active in all quadrants.. No liver or spleen enlargement or tenderness.Marland Kitchen Psychiatric No evidence of depression, anxiety, or agitation. Calm, cooperative, and communicative. Appropriate interactions and affect.. General Notes: Wound exam; small open area on the left greater trochanter surface appears dry however the dimensions are better. Still some eschar although it appears to be closing without any debridement necessary Integumentary (Hair, Skin) No rash the patient does have chronic venous insufficiency but has no wound history here. Wound #1 status is Open. Original cause of wound was Pressure Injury. The wound is located on the Left Trochanter. The wound measures 0.2cm length x 0.3cm width x 0.1cm depth; 0.047cm^2 area and 0.005cm^3 volume. The wound is limited to skin breakdown. There is no tunneling or undermining noted. There is a medium amount of serous drainage noted. The wound margin is flat and intact. There is no granulation within the wound bed. There is a large (67-100%) amount of necrotic tissue within the wound bed including Adherent Slough. The periwound skin appearance did not exhibit: Callus, Crepitus, Excoriation, Induration, Rash, Scarring, Dry/Scaly, Maceration, Atrophie Blanche, Cyanosis, Ecchymosis, Hemosiderin Staining, Mottled, Pallor, Rubor, Erythema. Assessment Active Problems ICD-10 L89.220 - Pressure ulcer of left hip, unstageable R63.4 - Abnormal weight loss J84.10 - Pulmonary fibrosis, unspecified [name] [name], [name] [name] ([id number]) Plan Wound Cleansing: Wound #1 Left Trochanter: May Shower, gently pat wound dry prior to applying new dressing. Anesthetic: Wound #1 Left Trochanter: Topical Lidocaine 4% cream applied to wound bed prior to debridement - in clinic only Skin Barriers/Peri-Wound Care: Wound #1 Left  Trochanter: Skin Prep Primary Wound Dressing: Wound #1 Left Trochanter: Iodosorb Ointment Secondary Dressing: Wound #1 Left Trochanter: Boardered Foam Dressing Dressing Change Frequency: Wound #1 Left Trochanter: Change dressing every other day. Follow-up Appointments: Wound #1 Left Trochanter: Return Appointment in 1 week. Off-Loading: Wound #1 Left Trochanter: Turn and reposition every 2 hours - Try to stay off of left hip. Additional Orders / Instructions: Wound #1 Left Trochanter: Other: - Do not leave home without Oxygen. #1 continue with Iodosorb ointment and border fall #2 surprisingly brisk improvement #3 his wife states he is being quite religious about offloading Electronic Signature(s) Signed: 01/19/2017 2:30:45 PM By: Linton Ham MD Entered By: Linton Ham on 01/19/2017 14:30:45 [name] [name] ([id number]) [name] [name], [name] [name] ([id number]) -------------------------------------------------------------------------------- [place] Details Patient Name: [name] [name] Date of Service: 01/19/2017 Medical Record Number: [id number] Patient Account Number: [id number] Date of Birth/Sex:  [birth year] (83 y.o. Male) Treating RN: [name] Primary Care Provider: [name] Other Clinician: Referring Provider: [name] Treating Provider/Extender: [name] [name] in Treatment: 2 Diagnosis Coding ICD-10 Codes Code Description [phone] Pressure ulcer of left hip, unstageable R63.4 Abnormal weight loss J84.10 Pulmonary fibrosis, unspecified Facility Procedures CPT4 Code: [id number] Description: [phone] - WOUND CARE VISIT-LEV 2 EST PT Modifier: Quantity: 1 Physician Procedures CPT4 Code: [id number] Description: [id number] - WC PHYS LEVEL 3 - EST PT ICD-10 Description Diagnosis L89.220 Pressure ulcer of left hip, unstageable Modifier: Quantity: 1 Electronic Signature(s) Signed: 01/19/2017 5:12:22 PM By: Linton Ham MD Entered By: Linton Ham on  01/19/2017 14:31:10

## 2017-01-26 ENCOUNTER — Encounter: Payer: Medicare Other | Admitting: Internal Medicine

## 2017-01-26 DIAGNOSIS — L89220 Pressure ulcer of left hip, unstageable: Secondary | ICD-10-CM | POA: Diagnosis not present

## 2017-01-27 NOTE — Progress Notes (Signed)
KORI, COLIN ([id number]) Visit Report for 01/26/2017 Chief Complaint Document Details Patient Name: [name] [name], [name] [name] Date of Service: 01/26/2017 12:30 PM Medical Record Number: [id number] Patient Account Number: [id number] Date of Birth/Sex: 1928-12-12 (83 y.o. Male) Treating RN: Montey Hora Primary Care Provider: [name] Other Clinician: Referring Provider: [name] Treating Provider/Extender: Tito Dine in Treatment: 3 Information Obtained from: Patient Chief Complaint The patient is here for initial evaluation of his left trocanter pressure ulcer Electronic Signature(s) Signed: 01/26/2017 4:33:23 PM By: Linton Ham MD Entered By: Linton Ham on 01/26/2017 12:54:38 [name] [name] ([id number]) -------------------------------------------------------------------------------- Debridement Details Patient Name: [name] [name] Date of Service: 01/26/2017 12:30 PM Medical Record Number: [id number] Patient Account Number: [id number] Date of Birth/Sex: 02/23/1929 (83 y.o. Male) Treating RN: Montey Hora Primary Care Provider: [name] Other Clinician: Referring Provider: [name] Treating Provider/Extender: Tito Dine in Treatment: 3 Debridement Performed for Wound #1 Left Trochanter Assessment: Performed By: Physician Ricard Dillon, MD Debridement: Debridement Pre-procedure Yes - 12:49 Verification/Time Out Taken: Start Time: 12:49 Pain Control: Lidocaine 4% Topical Solution Level: Skin/Subcutaneous Tissue Total Area Debrided (L x 0.5 (cm) x 0.4 (cm) = 0.2 (cm) W): Tissue and other Viable, Non-Viable, Fibrin/Slough, Subcutaneous material debrided: Instrument: Curette Bleeding: Minimum Hemostasis Achieved: Pressure End Time: 12:51 Procedural Pain: 0 Post Procedural Pain: 0 Response to Treatment: Procedure was tolerated well Post Debridement Measurements of Total Wound Length: (cm) 0.5 Stage:  Unstageable/Unclassified Width: (cm) 0.4 Depth: (cm) 0.2 Volume: (cm) 0.031 Character of Wound/Ulcer Post Improved Debridement: Severity of Tissue Post Fat layer exposed Debridement: Post Procedure Diagnosis Same as Pre-procedure Electronic Signature(s) Signed: 01/26/2017 4:33:23 PM By: Linton Ham MD Signed: 01/26/2017 5:41:54 PM By: Montey Hora Entered By: Linton Ham on 01/26/2017 12:54:31 [name] [name] ([id number]) [name] [name], [name] [name] ([id number]) -------------------------------------------------------------------------------- HPI Details Patient Name: [name] [name] Date of Service: 01/26/2017 12:30 PM Medical Record Number: [id number] Patient Account Number: [id number] Date of Birth/Sex: 1929-06-20 (83 y.o. Male) Treating RN: Montey Hora Primary Care Provider: [name] Other Clinician: Referring Provider: [name] Treating Provider/Extender: Tito Dine in Treatment: 3 History of Present Illness Location: left trocanter Quality: denies pain Duration: the ulcer was noticed two weeks ago Context: ulcer is secondary to pressure Modifying Factors: unrelieved pressure is an exacerbating factor HPI Description: 01/05/17- the patient is here for initial violation of a left trochanter pressure ulcer that was originally noticed by his wife a few weeks ago. He states that he traditionally sleeps on his left side. He has lost between 20 and 30 pounds of the past 6 months. He cannot articulate a specific reason for this he states that it's not a loss of appetite. He denies getting fatigued with eating, despite having pulmonary fibrosis, COPD and being exertionally oxygen dependent. They have been applying neosporin. Lab work obtained in January revealed an albumin level 3.7. He is a non-diabetic, former smoker (quitting > 40 yrs ago) 01/12/17; left trochanter ulcer. smaller using iodosorb which was started last week. He is hypoxic. does  not keep O2 on reliable at least here. When this happen P02 drops in the 70s 01/19/17; left trochanteric ulcer using Iodosorb about half the size of last week. O2 sat is 96% on 2 L respiratory status seems more stable 01/26/17 wouind measures slightly larger this week. non viable surface. Using iodosorb Electronic Signature(s) Signed: 01/26/2017 4:33:23 PM By: Linton Ham MD Entered By: Linton Ham on 01/26/2017 12:55:17 [name] [name] ([id number]) -------------------------------------------------------------------------------- Physical Exam Details  Patient Name: [name] [name], [name] [name] Date of Service: 01/26/2017 12:30 PM Medical Record Number: [id number] Patient Account Number: [id number] Date of Birth/Sex: 02-12-1929 (83 y.o. Male) Treating RN: Montey Hora Primary Care Provider: [name] Other Clinician: Referring Provider: [name] Treating Provider/Extender: Ricard Dillon Weeks in Treatment: 3 Constitutional Sitting or standing Blood Pressure is within target range for patient.. Pulse regular and within target range for patient.Marland Kitchen Respirations regular, non-labored and within target range.. Temperature is normal and within the target range for the patient.. Patient's appearance is neat and clean. Appears in no acute distress. Well nourished and well developed.. Notes wound exam' area on the left greater trochanter. non viable surface. #3 curette debrided of necrotic surface material hemostasis direct pressure Electronic Signature(s) Signed: 01/26/2017 4:33:23 PM By: Linton Ham MD Entered By: Linton Ham on 01/26/2017 12:57:12 [name] [name] ([id number]) -------------------------------------------------------------------------------- Physician Orders Details Patient Name: [name] [name] Date of Service: 01/26/2017 12:30 PM Medical Record Number: [id number] Patient Account Number: [id number] Date of Birth/Sex: [birth year] (83 y.o. Male) Treating  RN: Montey Hora Primary Care Provider: [name] Other Clinician: Referring Provider: [name] Treating Provider/Extender: Tito Dine in Treatment: 3 Verbal / Phone Orders: No Diagnosis Coding Wound Cleansing Wound #1 Left Trochanter o May Shower, gently pat wound dry prior to applying new dressing. Anesthetic Wound #1 Left Trochanter o Topical Lidocaine 4% cream applied to wound bed prior to debridement - in clinic only Skin Barriers/Peri-Wound Care Wound #1 Left Trochanter o Skin Prep Primary Wound Dressing Wound #1 Left Trochanter o Prisma Ag Secondary Dressing Wound #1 Left Trochanter o Boardered Foam Dressing Dressing Change Frequency Wound #1 Left Trochanter o Change dressing every other day. Follow-up Appointments Wound #1 Left Trochanter o Return Appointment in 1 week. Off-Loading Wound #1 Left Trochanter o Turn and reposition every 2 hours - Try to stay off of left hip. Additional Orders / Instructions Wound #1 Left Trochanter [name] [name], [name] [name] ([id number]) o Other: - Do not leave home without Oxygen. Electronic Signature(s) Signed: 01/26/2017 4:33:23 PM By: Linton Ham MD Signed: 01/26/2017 5:41:54 PM By: Montey Hora Entered By: Montey Hora on 01/26/2017 12:51:55 [name] [name] ([id number]) -------------------------------------------------------------------------------- Problem List Details Patient Name: [name] [name] Date of Service: 01/26/2017 12:30 PM Medical Record Number: [id number] Patient Account Number: [id number] Date of Birth/Sex: [birth year] (83 y.o. Male) Treating RN: Montey Hora Primary Care Provider: [name] Other Clinician: Referring Provider: [name] Treating Provider/Extender: Ricard Dillon Weeks in Treatment: 3 Active Problems ICD-10 Encounter Code Description Active Date Diagnosis L89.220 Pressure ulcer of left hip, unstageable 01/05/2017 Yes R63.4 Abnormal  weight loss 01/05/2017 Yes J84.10 Pulmonary fibrosis, unspecified 01/05/2017 Yes Inactive Problems Resolved Problems Electronic Signature(s) Signed: 01/26/2017 4:33:23 PM By: Linton Ham MD Entered By: Linton Ham on 01/26/2017 12:54:06 [name] [name] ([id number]) -------------------------------------------------------------------------------- Progress Note Details Patient Name: [name] [name] Date of Service: 01/26/2017 12:30 PM Medical Record Number: [id number] Patient Account Number: [id number] Date of Birth/Sex: 17-Apr-1929 (83 y.o. Male) Treating RN: Montey Hora Primary Care Provider: [name] Other Clinician: Referring Provider: [name] Treating Provider/Extender: Tito Dine in Treatment: 3 Subjective Chief Complaint Information obtained from Patient The patient is here for initial evaluation of his left trocanter pressure ulcer History of Present Illness (HPI) The following HPI elements were documented for the patient's wound: Location: left trocanter Quality: denies pain Duration: the ulcer was noticed two weeks ago Context: ulcer is secondary to pressure Modifying Factors: unrelieved pressure is an exacerbating factor 01/05/17-  the patient is here for initial violation of a left trochanter pressure ulcer that was originally noticed by his wife a few weeks ago. He states that he traditionally sleeps on his left side. He has lost between 20 and 30 pounds of the past 6 months. He cannot articulate a specific reason for this he states that it's not a loss of appetite. He denies getting fatigued with eating, despite having pulmonary fibrosis, COPD and being exertionally oxygen dependent. They have been applying neosporin. Lab work obtained in January revealed an albumin level 3.7. He is a non-diabetic, former smoker (quitting > 40 yrs ago) 01/12/17; left trochanter ulcer. smaller using iodosorb which was started last week. He is hypoxic. does  not keep O2 on reliable at least here. When this happen P02 drops in the 70s 01/19/17; left trochanteric ulcer using Iodosorb about half the size of last week. O2 sat is 96% on 2 L respiratory status seems more stable 01/26/17 wouind measures slightly larger this week. non viable surface. Using iodosorb Objective Constitutional Sitting or standing Blood Pressure is within target range for patient.. Pulse regular and within target range for patient.Marland Kitchen Respirations regular, non-labored and within target range.. Temperature is normal and within the target range for the patient.. Patient's appearance is neat and clean. Appears in no acute distress. Well nourished and well developed.Marland Kitchen [name] [name], [name] [name] ([id number]) Vitals Time Taken: 12:42 PM, Height: 71 in, Weight: 143 lbs, BMI: 19.9, Pulse: 68 bpm, Respiratory Rate: 18 breaths/min, Blood Pressure: 108/54 mmHg. General Notes: wound exam' area on the left greater trochanter. non viable surface. #3 curette debrided of necrotic surface material hemostasis direct pressure Integumentary (Hair, Skin) Wound #1 status is Open. Original cause of wound was Pressure Injury. The wound is located on the Left Trochanter. The wound measures 0.5cm length x 0.4cm width x 0.1cm depth; 0.157cm^2 area and 0.016cm^3 volume. The wound is limited to skin breakdown. There is no tunneling or undermining noted. There is a medium amount of serous drainage noted. The wound margin is flat and intact. There is medium (34-66%) red granulation within the wound bed. There is a medium (34-66%) amount of necrotic tissue within the wound bed including Adherent Slough. The periwound skin appearance did not exhibit: Callus, Crepitus, Excoriation, Induration, Rash, Scarring, Dry/Scaly, Maceration, Atrophie Blanche, Cyanosis, Ecchymosis, Hemosiderin Staining, Mottled, Pallor, Rubor, Erythema. Assessment Active Problems ICD-10 L89.220 - Pressure ulcer of left hip, unstageable R63.4 -  Abnormal weight loss J84.10 - Pulmonary fibrosis, unspecified Procedures Wound #1 Wound #1 is a Pressure Ulcer located on the Left Trochanter . There was a Skin/Subcutaneous Tissue Debridement ([id number]) debridement with total area of 0.2 sq cm performed by Ricard Dillon, MD. with the following instrument(s): Curette to remove Viable and Non-Viable tissue/material including Fibrin/Slough and Subcutaneous after achieving pain control using Lidocaine 4% Topical Solution. A time out was conducted at 12:49, prior to the start of the procedure. A Minimum amount of bleeding was controlled with Pressure. The procedure was tolerated well with a pain level of 0 throughout and a pain level of 0 following the procedure. Post Debridement Measurements: 0.5cm length x 0.4cm width x 0.2cm depth; 0.031cm^3 volume. Post debridement Stage noted as Unstageable/Unclassified. Character of Wound/Ulcer Post Debridement is improved. Severity of Tissue Post Debridement is: Fat layer exposed. Post procedure Diagnosis Wound #1: Same as Pre-Procedure [name] [name], [name] [name] ([id number]) Plan Wound Cleansing: Wound #1 Left Trochanter: May Shower, gently pat wound dry prior to applying new dressing. Anesthetic: Wound #1 Left Trochanter: Topical  Lidocaine 4% cream applied to wound bed prior to debridement - in clinic only Skin Barriers/Peri-Wound Care: Wound #1 Left Trochanter: Skin Prep Primary Wound Dressing: Wound #1 Left Trochanter: Prisma Ag Secondary Dressing: Wound #1 Left Trochanter: Boardered Foam Dressing Dressing Change Frequency: Wound #1 Left Trochanter: Change dressing every other day. Follow-up Appointments: Wound #1 Left Trochanter: Return Appointment in 1 week. Off-Loading: Wound #1 Left Trochanter: Turn and reposition every 2 hours - Try to stay off of left hip. Additional Orders / Instructions: Wound #1 Left Trochanter: Other: - Do not leave home without Oxygen. change to prisma  border foam, change q2d Electronic Signature(s) Signed: 01/26/2017 4:33:23 PM By: Linton Ham MD Entered By: Linton Ham on 01/26/2017 12:57:40 [name] [name] ([id number]) [name] [name], [name] [name] ([id number]) -------------------------------------------------------------------------------- [place] Details Patient Name: [name] [name] Date of Service: 01/26/2017 Medical Record Number: [id number] Patient Account Number: [id number] Date of Birth/Sex: 02-08-1929 (83 y.o. Male) Treating RN: Montey Hora Primary Care Provider: [name] Other Clinician: Referring Provider: [name] Treating Provider/Extender: Tito Dine in Treatment: 3 Diagnosis Coding ICD-10 Codes Code Description [phone] Pressure ulcer of left hip, unstageable R63.4 Abnormal weight loss J84.10 Pulmonary fibrosis, unspecified Facility Procedures CPT4 Code: [id number] Description: 37096 - DEB SUBQ TISSUE 20 SQ CM/< ICD-10 Description Diagnosis L89.220 Pressure ulcer of left hip, unstageable Modifier: Quantity: 1 Physician Procedures CPT4 Code: [id number] Description: 40375 - WC PHYS SUBQ TISS 20 SQ CM ICD-10 Description Diagnosis L89.220 Pressure ulcer of left hip, unstageable Modifier: Quantity: 1 Electronic Signature(s) Signed: 01/26/2017 4:33:23 PM By: Linton Ham MD Entered By: Linton Ham on 01/26/2017 12:57:54

## 2017-01-27 NOTE — Progress Notes (Signed)
KAMUELA, MAGOS ([id number]) Visit Report for 01/26/2017 Arrival Information Details Patient Name: [name] [name], [name] [name] Date of Service: 01/26/2017 12:30 PM Medical Record Number: [id number] Patient Account Number: [id number] Date of Birth/Sex: [birth year] (83 y.o. Male) Treating RN: Montey Hora Primary Care Marvin Maenza: [name] Other Clinician: Referring Royann Wildasin: [name] Treating Jaeliana Lococo/Extender: Tito Dine in Treatment: 3 Visit Information History Since Last Visit Added or deleted any medications: No Patient Arrived: Wheel Chair Any new allergies or adverse reactions: No Arrival Time: 12:38 Had a fall or experienced change in No Accompanied By: spouse and cg activities of daily living that may affect Transfer Assistance: None risk of falls: Patient Identification Verified: Yes Signs or symptoms of abuse/neglect since last No Secondary Verification Process Yes visito Completed: Hospitalized since last visit: No Patient Requires Transmission- No Has Dressing in Place as Prescribed: Yes Based Precautions: Pain Present Now: No Patient Has Alerts: Yes Patient Alerts: Patient on Blood Thinner Warfarin NOT Diabetic Electronic Signature(s) Signed: 01/26/2017 5:41:54 PM By: Montey Hora Entered By: Montey Hora on 01/26/2017 12:42:00 [name] [name] ([id number]) -------------------------------------------------------------------------------- Encounter Discharge Information Details Patient Name: [name] [name] Date of Service: 01/26/2017 12:30 PM Medical Record Number: [id number] Patient Account Number: [id number] Date of Birth/Sex: 02-24-1929 (84 y.o. Male) Treating RN: Montey Hora Primary Care Jeanluc Wegman: [name] Other Clinician: Referring Kyrin Gratz: [name] Treating Darling Cieslewicz/Extender: Tito Dine in Treatment: 3 Encounter Discharge Information Items Discharge Pain Level: 0 Discharge Condition: Stable Ambulatory  Status: Wheelchair Discharge Destination: Home Transportation: Private Auto Accompanied By: spouse Schedule Follow-up Appointment: Yes Medication Reconciliation completed and provided to Patient/Care No Duwayne Matters: Provided on Clinical Summary of Care: 01/26/2017 Form Type Recipient Paper Patient CG Electronic Signature(s) Signed: 01/26/2017 1:10:09 PM By: Montey Hora Previous Signature: 01/26/2017 1:06:04 PM Version By: Ruthine Dose Entered By: Montey Hora on 01/26/2017 13:10:09 [name] [name] ([id number]) -------------------------------------------------------------------------------- Multi Wound Chart Details Patient Name: [name] [name] Date of Service: 01/26/2017 12:30 PM Medical Record Number: [id number] Patient Account Number: [id number] Date of Birth/Sex: 22-Mar-1929 (83 y.o. Male) Treating RN: Montey Hora Primary Care Osby Sweetin: [name] Other Clinician: Referring Domanique Huesman: [name] Treating Torrance Stockley/Extender: Tito Dine in Treatment: 3 Vital Signs Height(in): 71 Pulse(bpm): 68 Weight(lbs): 143 Blood Pressure 108/54 (mmHg): Body Mass Index(BMI): 20 Temperature(F): Respiratory Rate 18 (breaths/min): Photos: [1:No Photos] [N/A:N/A] Wound Location: [1:Left Trochanter] [N/A:N/A] Wounding Event: [1:Pressure Injury] [N/A:N/A] Primary Etiology: [1:Pressure Ulcer] [N/A:N/A] Comorbid History: [1:Cataracts, Chronic Obstructive Pulmonary Disease (COPD), Arrhythmia, Congestive Heart Failure, Coronary Artery Disease, Hypotension, Myocardial Infarction, Gout, Osteoarthritis] [N/A:N/A] Date Acquired: [1:12/22/2016] [N/A:N/A] Weeks of Treatment: [1:3] [N/A:N/A] Wound Status: [1:Open] [N/A:N/A] Measurements L x W x D 0.5x0.4x0.1 [N/A:N/A] (cm) Area (cm) : [1:0.157] [N/A:N/A] Volume (cm) : [1:0.016] [N/A:N/A] % Reduction in Area: [1:52.40%] [N/A:N/A] % Reduction in Volume: 51.50% [N/A:N/A] Classification: [1:Unstageable/Unclassified]  [N/A:N/A] Exudate Amount: [1:Medium] [N/A:N/A] Exudate Type: [1:Serous] [N/A:N/A] Exudate Color: [1:amber] [N/A:N/A] Wound Margin: [1:Flat and Intact] [N/A:N/A] Granulation Amount: [1:Medium (34-66%)] [N/A:N/A] Granulation Quality: [1:Red] [N/A:N/A] Necrotic Amount: [1:Medium (34-66%)] [N/A:N/A] Exposed Structures: Fascia: No N/A N/A Fat Layer (Subcutaneous Tissue) Exposed: No Tendon: No Muscle: No Joint: No Bone: No Limited to Skin Breakdown Epithelialization: Small (1-33%) N/A N/A Debridement: Debridement ([id number]- N/A N/A 11047) Pre-procedure 12:49 N/A N/A Verification/Time Out Taken: Pain Control: Lidocaine 4% Topical N/A N/A Solution Tissue Debrided: Fibrin/Slough, N/A N/A Subcutaneous Level: Skin/Subcutaneous N/A N/A Tissue Debridement Area (sq 0.2 N/A N/A cm): Instrument: Curette N/A N/A Bleeding: Minimum N/A N/A Hemostasis Achieved: Pressure N/A N/A Procedural Pain: 0  N/A N/A Post Procedural Pain: 0 N/A N/A Debridement Treatment Procedure was tolerated N/A N/A Response: well Post Debridement 0.5x0.4x0.2 N/A N/A Measurements L x W x D (cm) Post Debridement 0.031 N/A N/A Volume: (cm) Post Debridement Unstageable/Unclassified N/A N/A Stage: Periwound Skin Texture: Excoriation: No N/A N/A Induration: No Callus: No Crepitus: No Rash: No Scarring: No Periwound Skin Maceration: No N/A N/A Moisture: Dry/Scaly: No Periwound Skin Color: Atrophie Blanche: No N/A N/A Cyanosis: No Ecchymosis: No Erythema: No Hemosiderin Staining: No [name] [name], [name] [name]. ([id number]) Mottled: No Pallor: No Rubor: No Tenderness on No N/A N/A Palpation: Wound Preparation: Ulcer Cleansing: N/A N/A Rinsed/Irrigated with Saline Topical Anesthetic Applied: Other: lidocaine 4% Procedures Performed: Debridement N/A N/A Treatment Notes Electronic Signature(s) Signed: 01/26/2017 4:33:23 PM By: Linton Ham MD Entered By: Linton Ham on 01/26/2017 12:54:22 [name] [name] ([id number]) -------------------------------------------------------------------------------- [place] Details Patient Name: [name] [name] Date of Service: 01/26/2017 12:30 PM Medical Record Number: [id number] Patient Account Number: [id number] Date of Birth/Sex: 25-Dec-1928 (83 y.o. Male) Treating RN: Montey Hora Primary Care Tahjai Schetter: [name] Other Clinician: Referring Trannie Bardales: [name] Treating Latha Staunton/Extender: Tito Dine in Treatment: 3 Active Inactive ` Abuse / Safety / Falls / Self Care Management Nursing Diagnoses: Impaired physical mobility Potential for falls Goals: Patient/caregiver will verbalize understanding of skin care regimen Date Initiated: 01/05/2017 Target Resolution Date: 03/29/2017 Goal Status: Active Patient/caregiver will verbalize/demonstrate measures taken to prevent injury and/or falls Date Initiated: 01/05/2017 Target Resolution Date: 03/29/2017 Goal Status: Active Interventions: Assess fall risk on admission and as needed Assess self care needs on admission and as needed Notes: ` Orientation to the Wound Care Program Nursing Diagnoses: Knowledge deficit related to the wound healing center program Goals: Patient/caregiver will verbalize understanding of the [facility] Date Initiated: 01/05/2017 Target Resolution Date: 03/29/2017 Goal Status: Active Interventions: Provide education on orientation to the wound center Notes: [name] [name], [name] [name] ([id number]) Pressure Nursing Diagnoses: Knowledge deficit related to management of pressures ulcers Goals: Patient will remain free of pressure ulcers Date Initiated: 01/05/2017 Target Resolution Date: 03/29/2017 Goal Status: Active Patient/caregiver will verbalize understanding of pressure ulcer management Date Initiated: 01/05/2017 Target Resolution Date: 03/29/2017 Goal Status: Active Interventions: Assess offloading mechanisms  upon admission and as needed Notes: ` Wound/Skin Impairment Nursing Diagnoses: Knowledge deficit related to ulceration/compromised skin integrity Goals: Ulcer/skin breakdown will heal within 14 weeks Date Initiated: 01/05/2017 Target Resolution Date: 04/06/2017 Goal Status: Active Interventions: Assess patient/caregiver ability to perform ulcer/skin care regimen upon admission and as needed Assess ulceration(s) every visit Treatment Activities: Skin care regimen initiated : 01/05/2017 Topical wound management initiated : 01/05/2017 Notes: Electronic Signature(s) Signed: 01/26/2017 5:41:54 PM By: Montey Hora Entered By: Montey Hora on 01/26/2017 12:50:05 [name] [name] ([id number]) -------------------------------------------------------------------------------- Pain Assessment Details Patient Name: [name] [name] Date of Service: 01/26/2017 12:30 PM Medical Record Number: [id number] Patient Account Number: [id number] Date of Birth/Sex: [birth year] (83 y.o. Male) Treating RN: Montey Hora Primary Care Normon Pettijohn: [name] Other Clinician: Referring Daena Alper: [name] Treating Morrigan Wickens/Extender: Tito Dine in Treatment: 3 Active Problems Location of Pain Severity and Description of Pain Patient Has Paino No Site Locations Pain Management and Medication Current Pain Management: Electronic Signature(s) Signed: 01/26/2017 5:41:54 PM By: Montey Hora Entered By: Montey Hora on 01/26/2017 12:42:10 [name] [name] ([id number]) -------------------------------------------------------------------------------- Patient/Caregiver Education Details Patient Name: [name] [name] Date of Service: 01/26/2017 12:30 PM Medical Record Number: [id number] Patient Account Number: [id number] Date of Birth/Gender: 12-Jun-1929 (83 y.o. Male)  Treating RN: Montey Hora Primary Care Physician: [name] Other Clinician: Referring Physician: [name] Treating Physician/Extender: Tito Dine in Treatment: 3 Education Assessment Education Provided To: Patient and Caregiver Education Topics Provided Wound/Skin Impairment: Handouts: Other: wound care as ordered Methods: Demonstration, Explain/Verbal Responses: State content correctly Electronic Signature(s) Signed: 01/26/2017 5:41:54 PM By: Montey Hora Entered By: Montey Hora on 01/26/2017 13:10:30 [name] [name] ([id number]) -------------------------------------------------------------------------------- Wound Assessment Details Patient Name: [name] [name] Date of Service: 01/26/2017 12:30 PM Medical Record Number: [id number] Patient Account Number: [id number] Date of Birth/Sex: [birth year] (83 y.o. Male) Treating RN: Montey Hora Primary Care Phuong Moffatt: [name] Other Clinician: Referring Solomia Harrell: [name] Treating Tenisha Fleece/Extender: Tito Dine in Treatment: 3 Wound Status Wound Number: 1 Primary Pressure Ulcer Etiology: Wound Location: Left Trochanter Wound Open Wounding Event: Pressure Injury Status: Date Acquired: 12/22/2016 Comorbid Cataracts, Chronic Obstructive Weeks Of Treatment: 3 History: Pulmonary Disease (COPD), Arrhythmia, Clustered Wound: No Congestive Heart Failure, Coronary Artery Disease, Hypotension, Myocardial Infarction, Gout, Osteoarthritis Photos Photo Uploaded By: Montey Hora on 01/26/2017 16:14:08 Wound Measurements Length: (cm) 0.5 Width: (cm) 0.4 Depth: (cm) 0.1 Area: (cm) 0.157 Volume: (cm) 0.016 % Reduction in Area: 52.4% % Reduction in Volume: 51.5% Epithelialization: Small (1-33%) Tunneling: No Undermining: No Wound Description Classification: Unstageable/Unclassified Wound Margin: Flat and Intact Exudate Amount: Medium Exudate Type: Serous Exudate Color: amber Foul Odor After Cleansing: No Slough/Fibrino Yes Wound Bed Granulation Amount: Medium (34-66%) Exposed  Structure Granulation Quality: Red Fascia Exposed: No [name] [name], [name] [name] ([id number]) Necrotic Amount: Medium (34-66%) Fat Layer (Subcutaneous Tissue) Exposed: No Necrotic Quality: Adherent Slough Tendon Exposed: No Muscle Exposed: No Joint Exposed: No Bone Exposed: No Limited to Skin Breakdown Periwound Skin Texture Texture Color No Abnormalities Noted: No No Abnormalities Noted: No Callus: No Atrophie Blanche: No Crepitus: No Cyanosis: No Excoriation: No Ecchymosis: No Induration: No Erythema: No Rash: No Hemosiderin Staining: No Scarring: No Mottled: No Pallor: No Moisture Rubor: No No Abnormalities Noted: No Dry / Scaly: No Maceration: No Wound Preparation Ulcer Cleansing: Rinsed/Irrigated with Saline Topical Anesthetic Applied: Other: lidocaine 4%, Treatment Notes Wound #1 (Left Trochanter) 1. Cleansed with: Clean wound with Normal Saline 2. Anesthetic Topical Lidocaine 4% cream to wound bed prior to debridement 4. Dressing Applied: Prisma Ag 5. Secondary Dressing Applied Bordered Foam Dressing Electronic Signature(s) Signed: 01/26/2017 5:41:54 PM By: Montey Hora Entered By: Montey Hora on 01/26/2017 12:49:57 [name] [name] ([id number]) -------------------------------------------------------------------------------- [place] Details Patient Name: [name] [name] Date of Service: 01/26/2017 12:30 PM Medical Record Number: [id number] Patient Account Number: [id number] Date of Birth/Sex: 11-03-1928 (83 y.o. Male) Treating RN: Montey Hora Primary Care Teaghan Formica: [name] Other Clinician: Referring Estanislao Harmon: [name] Treating Patra Gherardi/Extender: Tito Dine in Treatment: 3 Vital Signs Time Taken: 12:42 Pulse (bpm): 68 Height (in): 71 Respiratory Rate (breaths/min): 18 Weight (lbs): 143 Blood Pressure (mmHg): 108/54 Body Mass Index (BMI): 19.9 Reference Range: 80 - 120 mg / dl Electronic Signature(s) Signed:  01/26/2017 5:41:54 PM By: Montey Hora Entered By: Montey Hora on 01/26/2017 12:42:31

## 2017-02-02 ENCOUNTER — Encounter: Payer: Medicare Other | Attending: Internal Medicine | Admitting: Internal Medicine

## 2017-02-02 DIAGNOSIS — Z9981 Dependence on supplemental oxygen: Secondary | ICD-10-CM | POA: Diagnosis not present

## 2017-02-02 DIAGNOSIS — J449 Chronic obstructive pulmonary disease, unspecified: Secondary | ICD-10-CM | POA: Insufficient documentation

## 2017-02-02 DIAGNOSIS — Z87891 Personal history of nicotine dependence: Secondary | ICD-10-CM | POA: Diagnosis not present

## 2017-02-02 DIAGNOSIS — L89220 Pressure ulcer of left hip, unstageable: Secondary | ICD-10-CM | POA: Insufficient documentation

## 2017-02-02 DIAGNOSIS — R634 Abnormal weight loss: Secondary | ICD-10-CM | POA: Insufficient documentation

## 2017-02-02 DIAGNOSIS — J8410 Pulmonary fibrosis, unspecified: Secondary | ICD-10-CM | POA: Diagnosis not present

## 2017-02-04 NOTE — Progress Notes (Signed)
TATSUYA, OKRAY ([id number]) Visit Report for 02/02/2017 Arrival Information Details Patient Name: [name] [name], [name] [name] Date of Service: 02/02/2017 3:30 PM Medical Record Number: [id number] Patient Account Number: [id number] Date of Birth/Sex: [birth year] (83 y.o. Male) Treating RN: Ahmed Prima Primary Care Anahid Eskelson: [name] Other Clinician: Referring Shakaria Raphael: [name] Treating Hughie Melroy/Extender: Tito Dine in Treatment: 4 Visit Information History Since Last Visit All ordered tests and consults were completed: No Patient Arrived: Wheel Chair Added or deleted any medications: No Arrival Time: 15:27 Any new allergies or adverse reactions: No Accompanied By: wife Had a fall or experienced change in No Transfer Assistance: EasyP[employer] Patient activities of daily living that may affect Lift risk of falls: Patient Identification Verified: Yes Signs or symptoms of abuse/neglect since last No Secondary Verification Process Yes visito Completed: Hospitalized since last visit: No Patient Requires Transmission- No Has Dressing in Place as Prescribed: Yes Based Precautions: Pain Present Now: No Patient Has Alerts: Yes Patient Alerts: Patient on Blood Thinner Warfarin NOT Diabetic Electronic Signature(s) Signed: 02/02/2017 4:37:12 PM By: Alric Quan Entered By: Alric Quan on 02/02/2017 15:27:33 [name] [name] ([id number]) -------------------------------------------------------------------------------- Encounter Discharge Information Details Patient Name: [name] [name] Date of Service: 02/02/2017 3:30 PM Medical Record Number: [id number] Patient Account Number: [id number] Date of Birth/Sex: 1929/01/15 (83 y.o. Male) Treating RN: Ahmed Prima Primary Care Finnlee Silvernail: [name] Other Clinician: Referring Jema Deegan: [name] Treating Damek Ende/Extender: Tito Dine in Treatment: 4 Encounter Discharge Information  Items Discharge Pain Level: 0 Discharge Condition: Stable Ambulatory Status: Wheelchair Discharge Destination: Home Transportation: Private Auto Accompanied By: self Schedule Follow-up Appointment: Yes Medication Reconciliation completed No and provided to Patient/Care Keldon Lassen: Provided on Clinical Summary of Care: 02/02/2017 Form Type Recipient Paper Patient CG Electronic Signature(s) Signed: 02/02/2017 4:37:12 PM By: Alric Quan Previous Signature: 02/02/2017 4:00:00 PM Version By: Sharon Mt Entered By: Alric Quan on 02/02/2017 16:06:27 [name] [name] ([id number]) -------------------------------------------------------------------------------- Lower Extremity Assessment Details Patient Name: [name] [name] Date of Service: 02/02/2017 3:30 PM Medical Record Number: [id number] Patient Account Number: [id number] Date of Birth/Sex: 1928/10/26 (83 y.o. Male) Treating RN: Ahmed Prima Primary Care Salote Weidmann: [name] Other Clinician: Referring Dois Juarbe: [name] Treating Hilding Quintanar/Extender: Ricard Dillon Weeks in Treatment: 4 Electronic Signature(s) Signed: 02/02/2017 4:37:12 PM By: Alric Quan Entered By: Alric Quan on 02/02/2017 15:35:29 [name] [name] ([id number]) -------------------------------------------------------------------------------- Multi Wound Chart Details Patient Name: [name] [name] Date of Service: 02/02/2017 3:30 PM Medical Record Number: [id number] Patient Account Number: [id number] Date of Birth/Sex: 03-24-1929 (83 y.o. Male) Treating RN: Ahmed Prima Primary Care Deannah Rossi: [name] Other Clinician: Referring Jaxsyn Catalfamo: [name] Treating Dezmon Conover/Extender: Tito Dine in Treatment: 4 Vital Signs Height(in): 71 Pulse(bpm): 66 Weight(lbs): 143 Blood Pressure 98/51 (mmHg): Body Mass Index(BMI): 20 Temperature(F): Respiratory Rate 18 (breaths/min): Photos: [N/A:N/A] Wound  Location: Left Trochanter N/A N/A Wounding Event: Pressure Injury N/A N/A Primary Etiology: Pressure Ulcer N/A N/A Comorbid History: Cataracts, Chronic N/A N/A Obstructive Pulmonary Disease (COPD), Arrhythmia, Congestive Heart Failure, Coronary Artery Disease, Hypotension, Myocardial Infarction, Gout, Osteoarthritis Date Acquired: 12/22/2016 N/A N/A Weeks of Treatment: 4 N/A N/A Wound Status: Open N/A N/A Measurements L x W x D 0.7x0.6x0.1 N/A N/A (cm) Area (cm) : 0.33 N/A N/A Volume (cm) : 0.033 N/A N/A % Reduction in Area: 0.00% N/A N/A % Reduction in Volume: 0.00% N/A N/A Classification: Unstageable/Unclassified N/A N/A Exudate Amount: Large N/A N/A [name] [name], [name] [name] ([id number]) Exudate Type: Serous N/A N/A Exudate Color: amber N/A N/A Wound Margin: Flat  and Intact N/A N/A Granulation Amount: Medium (34-66%) N/A N/A Granulation Quality: Red N/A N/A Necrotic Amount: Medium (34-66%) N/A N/A Exposed Structures: Fascia: No N/A N/A Fat Layer (Subcutaneous Tissue) Exposed: No Tendon: No Muscle: No Joint: No Bone: No Limited to Skin Breakdown Epithelialization: Small (1-33%) N/A N/A Debridement: Debridement (02409- N/A N/A 11047) Pre-procedure 15:44 N/A N/A Verification/Time Out Taken: Pain Control: Lidocaine 4% Topical N/A N/A Solution Tissue Debrided: Fibrin/Slough, Exudates, N/A N/A Subcutaneous Level: Skin/Subcutaneous N/A N/A Tissue Debridement Area (sq 0.42 N/A N/A cm): Instrument: Curette N/A N/A Bleeding: Minimum N/A N/A Hemostasis Achieved: Pressure N/A N/A Procedural Pain: 0 N/A N/A Post Procedural Pain: 0 N/A N/A Debridement Treatment Procedure was tolerated N/A N/A Response: well Post Debridement 0.7x0.6x0.1 N/A N/A Measurements L x W x D (cm) Post Debridement 0.033 N/A N/A Volume: (cm) Post Debridement Unstageable/Unclassified N/A N/A Stage: Periwound Skin Texture: Excoriation: No N/A N/A Induration: No Callus: No Crepitus:  No Rash: No Scarring: No N/A N/A [name] [name], [name] [name] ([id number]) Periwound Skin Maceration: No Moisture: Dry/Scaly: No Periwound Skin Color: Atrophie Blanche: No N/A N/A Cyanosis: No Ecchymosis: No Erythema: No Hemosiderin Staining: No Mottled: No Pallor: No Rubor: No Tenderness on No N/A N/A Palpation: Wound Preparation: Ulcer Cleansing: N/A N/A Rinsed/Irrigated with Saline Topical Anesthetic Applied: Other: lidocaine 4% Procedures Performed: Debridement N/A N/A Treatment Notes Wound #1 (Left Trochanter) 1. Cleansed with: Clean wound with Normal Saline 2. Anesthetic Topical Lidocaine 4% cream to wound bed prior to debridement 3. Peri-wound Care: Skin Prep 4. Dressing Applied: Iodosorb Ointment 5. Secondary Dressing Applied Bordered Foam Dressing Dry Gauze Electronic Signature(s) Signed: 02/02/2017 5:52:53 PM By: Linton Ham MD Previous Signature: 02/02/2017 4:37:12 PM Version By: Alric Quan Entered By: Linton Ham on 02/02/2017 17:47:47 [name] [name] ([id number]) -------------------------------------------------------------------------------- [place] Details Patient Name: [name] [name] Date of Service: 02/02/2017 3:30 PM Medical Record Number: [id number] Patient Account Number: [id number] Date of Birth/Sex: 1929-08-22 (83 y.o. Male) Treating RN: Ahmed Prima Primary Care Raywood Wailes: [name] Other Clinician: Referring Bryella Diviney: [name] Treating Yardley Lekas/Extender: Tito Dine in Treatment: 4 Active Inactive ` Abuse / Safety / Falls / Self Care Management Nursing Diagnoses: Impaired physical mobility Potential for falls Goals: Patient/caregiver will verbalize understanding of skin care regimen Date Initiated: 01/05/2017 Target Resolution Date: 03/29/2017 Goal Status: Active Patient/caregiver will verbalize/demonstrate measures taken to prevent injury and/or falls Date Initiated:  01/05/2017 Target Resolution Date: 03/29/2017 Goal Status: Active Interventions: Assess fall risk on admission and as needed Assess self care needs on admission and as needed Notes: ` Orientation to the Wound Care Program Nursing Diagnoses: Knowledge deficit related to the wound healing center program Goals: Patient/caregiver will verbalize understanding of the [place] Date Initiated: 01/05/2017 Target Resolution Date: 03/29/2017 Goal Status: Active Interventions: Provide education on orientation to the wound center Notes: [name] [name], [name] [name] ([id number]) Pressure Nursing Diagnoses: Knowledge deficit related to management of pressures ulcers Goals: Patient will remain free of pressure ulcers Date Initiated: 01/05/2017 Target Resolution Date: 03/29/2017 Goal Status: Active Patient/caregiver will verbalize understanding of pressure ulcer management Date Initiated: 01/05/2017 Target Resolution Date: 03/29/2017 Goal Status: Active Interventions: Assess offloading mechanisms upon admission and as needed Notes: ` Wound/Skin Impairment Nursing Diagnoses: Knowledge deficit related to ulceration/compromised skin integrity Goals: Ulcer/skin breakdown will heal within 14 weeks Date Initiated: 01/05/2017 Target Resolution Date: 04/06/2017 Goal Status: Active Interventions: Assess patient/caregiver ability to perform ulcer/skin care regimen upon admission and as needed Assess ulceration(s) every visit Treatment Activities: Skin care  regimen initiated : 01/05/2017 Topical wound management initiated : 01/05/2017 Notes: Electronic Signature(s) Signed: 02/02/2017 4:37:12 PM By: Alric Quan Entered By: Alric Quan on 02/02/2017 15:43:55 [name] [name] ([id number]) -------------------------------------------------------------------------------- Pain Assessment Details Patient Name: [name] [name] Date of Service: 02/02/2017 3:30 PM Medical Record Number:  [id number] Patient Account Number: [id number] Date of Birth/Sex: Feb 16, 1929 (83 y.o. Male) Treating RN: Ahmed Prima Primary Care Gerson Fauth: [name] Other Clinician: Referring Janila Arrazola: [name] Treating Chiyeko Ferre/Extender: Tito Dine in Treatment: 4 Active Problems Location of Pain Severity and Description of Pain Patient Has Paino No Site Locations With Dressing Change: No Pain Management and Medication Current Pain Management: Electronic Signature(s) Signed: 02/02/2017 4:37:12 PM By: Alric Quan Entered By: Alric Quan on 02/02/2017 15:27:38 [name] [name] ([id number]) -------------------------------------------------------------------------------- Patient/Caregiver Education Details Patient Name: [name] [name] Date of Service: 02/02/2017 3:30 PM Medical Record Number: [id number] Patient Account Number: [id number] Date of Birth/Gender: 19-Jul-1929 (83 y.o. Male) Treating RN: Ahmed Prima Primary Care Physician: [name] Other Clinician: Referring Physician: [name] Treating Physician/Extender: Tito Dine in Treatment: 4 Education Assessment Education Provided To: Patient Education Topics Provided Wound/Skin Impairment: Handouts: Other: change dressing as ordered Methods: Demonstration, Explain/Verbal Responses: State content correctly Electronic Signature(s) Signed: 02/02/2017 4:37:12 PM By: Alric Quan Entered By: Alric Quan on 02/02/2017 16:06:39 [name] [name] ([id number]) -------------------------------------------------------------------------------- Wound Assessment Details Patient Name: [name] [name] Date of Service: 02/02/2017 3:30 PM Medical Record Number: [id number] Patient Account Number: [id number] Date of Birth/Sex: March 19, 1929 (83 y.o. Male) Treating RN: Ahmed Prima Primary Care Odes Lolli: [name] Other Clinician: Referring [place] Kielbasa: [name] Treating  [name] [name]/Extender: Tito Dine in Treatment: 4 Wound Status Wound Number: 1 Primary Pressure Ulcer Etiology: Wound Location: Left Trochanter Wound Open Wounding Event: Pressure Injury Status: Date Acquired: 12/22/2016 Comorbid Cataracts, Chronic Obstructive Weeks Of Treatment: 4 History: Pulmonary Disease (COPD), Arrhythmia, Clustered Wound: No Congestive Heart Failure, Coronary Artery Disease, Hypotension, Myocardial Infarction, Gout, Osteoarthritis Photos Photo Uploaded By: Alric Quan on 02/02/2017 16:17:58 Wound Measurements Length: (cm) 0.7 Width: (cm) 0.6 Depth: (cm) 0.1 Area: (cm) 0.33 Volume: (cm) 0.033 % Reduction in Area: 0% % Reduction in Volume: 0% Epithelialization: Small (1-33%) Tunneling: No Undermining: No Wound Description Classification: Unstageable/Unclassified Wound Margin: Flat and Intact Exudate Amount: Large Exudate Type: Serous Exudate Color: amber Foul Odor After Cleansing: No Slough/Fibrino Yes Wound Bed Granulation Amount: Medium (34-66%) Exposed Structure Granulation Quality: Red Fascia Exposed: No [name] [name], [name] [name] ([id number]) Necrotic Amount: Medium (34-66%) Fat Layer (Subcutaneous Tissue) Exposed: No Necrotic Quality: Adherent Slough Tendon Exposed: No Muscle Exposed: No Joint Exposed: No Bone Exposed: No Limited to Skin Breakdown Periwound Skin Texture Texture Color No Abnormalities Noted: No No Abnormalities Noted: No Callus: No Atrophie Blanche: No Crepitus: No Cyanosis: No Excoriation: No Ecchymosis: No Induration: No Erythema: No Rash: No Hemosiderin Staining: No Scarring: No Mottled: No Pallor: No Moisture Rubor: No No Abnormalities Noted: No Dry / Scaly: No Maceration: No Wound Preparation Ulcer Cleansing: Rinsed/Irrigated with Saline Topical Anesthetic Applied: Other: lidocaine 4%, Treatment Notes Wound #1 (Left Trochanter) 1. Cleansed with: Clean wound with Normal Saline 2.  Anesthetic Topical Lidocaine 4% cream to wound bed prior to debridement 3. Peri-wound Care: Skin Prep 4. Dressing Applied: Iodosorb Ointment 5. Secondary Dressing Applied Bordered Foam Dressing Dry Gauze Electronic Signature(s) Signed: 02/02/2017 4:37:12 PM By: Alric Quan Entered By: Alric Quan on 02/02/2017 15:34:28 [name] [name] ([id number]) -------------------------------------------------------------------------------- [place] Details Patient Name: [name] [name] Date of Service: 02/02/2017 3:30 PM Medical  Record Number: [id number] Patient Account Number: [id number] Date of Birth/Sex: Jun 01, 1929 (83 y.o. Male) Treating RN: Ahmed Prima Primary Care Karan Inclan: [name] Other Clinician: Referring Shekela Goodridge: [name] Treating Maki Sweetser/Extender: Tito Dine in Treatment: 4 Vital Signs Time Taken: 15:29 Pulse (bpm): 66 Height (in): 71 Respiratory Rate (breaths/min): 18 Weight (lbs): 143 Blood Pressure (mmHg): 98/51 Body Mass Index (BMI): 19.9 Reference Range: 80 - 120 mg / dl Pulse Oximetry (%): 100 Notes pt has personal O2 via [employer] Electronic Signature(s) Signed: 02/02/2017 4:37:12 PM By: Alric Quan Entered By: Alric Quan on 02/02/2017 15:29:58

## 2017-02-05 NOTE — Progress Notes (Signed)
ADONAI, SELSOR ([id number]) Visit Report for 02/02/2017 Chief Complaint Document Details Patient Name: [name] [name] Date of Service: 02/02/2017 3:30 PM Medical Record Number: [id number] Patient Account Number: [id number] Date of Birth/Sex: Feb 12, 1929 (83 y.o. Male) Treating RN: Ahmed Prima Primary Care Provider: [name] Other Clinician: Referring Provider: [name] Treating Provider/Extender: Tito Dine in Treatment: 4 Information Obtained from: Patient Chief Complaint The patient is here for initial evaluation of his left trocanter pressure ulcer Electronic Signature(s) Signed: 02/02/2017 5:52:53 PM By: Linton Ham MD Entered By: Linton Ham on 02/02/2017 17:48:04 [name] [name] ([id number]) -------------------------------------------------------------------------------- Debridement Details Patient Name: [name] [name] Date of Service: 02/02/2017 3:30 PM Medical Record Number: [id number] Patient Account Number: [id number] Date of Birth/Sex: [birth year] (83 y.o. Male) Treating RN: Ahmed Prima Primary Care Provider: [name] Other Clinician: Referring Provider: [name] Treating Provider/Extender: Tito Dine in Treatment: 4 Debridement Performed for Wound #1 Left Trochanter Assessment: Performed By: Physician Ricard Dillon, MD Debridement: Debridement Pre-procedure Yes - 15:44 Verification/Time Out Taken: Start Time: 15:45 Pain Control: Lidocaine 4% Topical Solution Level: Skin/Subcutaneous Tissue Total Area Debrided (L x 0.7 (cm) x 0.6 (cm) = 0.42 (cm) W): Tissue and other Viable, Non-Viable, Exudate, Fibrin/Slough, Subcutaneous material debrided: Instrument: Curette Bleeding: Minimum Hemostasis Achieved: Pressure End Time: 15:47 Procedural Pain: 0 Post Procedural Pain: 0 Response to Treatment: Procedure was tolerated well Post Debridement Measurements of Total Wound Length: (cm) 0.7 Stage:  Unstageable/Unclassified Width: (cm) 0.6 Depth: (cm) 0.1 Volume: (cm) 0.033 Character of Wound/Ulcer Post Requires Further Debridement: Debridement Severity of Tissue Post Fat layer exposed Debridement: Post Procedure Diagnosis Same as Pre-procedure Electronic Signature(s) Signed: 02/02/2017 5:52:53 PM By: Linton Ham MD Signed: 02/03/2017 4:23:06 PM By: Alric Quan Previous Signature: 02/02/2017 4:37:12 PM Version By: Jobie Quaker ([id number]) Entered By: Linton Ham on 02/02/2017 17:47:56 [name] [name] ([id number]) -------------------------------------------------------------------------------- HPI Details Patient Name: [name] [name] Date of Service: 02/02/2017 3:30 PM Medical Record Number: [id number] Patient Account Number: [id number] Date of Birth/Sex: 03/05/1929 (83 y.o. Male) Treating RN: Ahmed Prima Primary Care Provider: [name] Other Clinician: Referring Provider: [name] Treating Provider/Extender: Tito Dine in Treatment: 4 History of Present Illness Location: left trocanter Quality: denies pain Duration: the ulcer was noticed two weeks ago Context: ulcer is secondary to pressure Modifying Factors: unrelieved pressure is an exacerbating factor HPI Description: 01/05/17- the patient is here for initial violation of a left trochanter pressure ulcer that was originally noticed by his wife a few weeks ago. He states that he traditionally sleeps on his left side. He has lost between 20 and 30 pounds of the past 6 months. He cannot articulate a specific reason for this he states that it's not a loss of appetite. He denies getting fatigued with eating, despite having pulmonary fibrosis, COPD and being exertionally oxygen dependent. They have been applying neosporin. Lab work obtained in January revealed an albumin level 3.7. He is a non-diabetic, former smoker (quitting > 40 yrs ago) 01/12/17; left  trochanter ulcer. smaller using iodosorb which was started last week. He is hypoxic. does not keep O2 on reliable at least here. When this happen P02 drops in the 70s 01/19/17; left trochanteric ulcer using Iodosorb about half the size of last week. O2 sat is 96% on 2 L respiratory status seems more stable 01/26/17 wouind measures slightly larger this week. non viable surface. Using iodosorb 02/02/17; again measurements unchanged this week. Non-viable surface. We had been using Iodosorb  with some improvement I switched off the Prisma last week but I can't really understand why that was done. Electronic Signature(s) Signed: 02/02/2017 5:52:53 PM By: Linton Ham MD Entered By: Linton Ham on 02/02/2017 17:48:44 [name] [name] ([id number]) -------------------------------------------------------------------------------- Physical Exam Details Patient Name: [name] [name] Date of Service: 02/02/2017 3:30 PM Medical Record Number: [id number] Patient Account Number: [id number] Date of Birth/Sex: May 12, 1929 (83 y.o. Male) Treating RN: Ahmed Prima Primary Care Provider: [name] Other Clinician: Referring Provider: [name] Treating Provider/Extender: Ricard Dillon Weeks in Treatment: 4 Constitutional Supine Blood Pressure is within target range for patient.. Pulse regular and within target range for patient.Marland Kitchen Respirations regular, non-labored and within target range.. Oxygen on appears stable. Notes Wound exam; areas on the left greater trochanter. Again a nonviable surface. #3 curet debrided of necrotic surface. Tolerates this well. Hemostasis with direct pressure. Base of this looks very healthy post debridement and I can understand really why this reoccurs. Constance Holster I really certain why I switched off the Iodoflex last week Electronic Signature(s) Signed: 02/02/2017 5:52:53 PM By: Linton Ham MD Entered By: Linton Ham on 02/02/2017 17:49:54 [name] [name]  ([id number]) -------------------------------------------------------------------------------- Physician Orders Details Patient Name: [name] [name] Date of Service: 02/02/2017 3:30 PM Medical Record Number: [id number] Patient Account Number: [id number] Date of Birth/Sex: 1929-07-15 (83 y.o. Male) Treating RN: Ahmed Prima Primary Care Provider: [name] Other Clinician: Referring Provider: [name] Treating Provider/Extender: Tito Dine in Treatment: 4 Verbal / Phone Orders: Yes Clinician: Carolyne Fiscal, Debi Read Back and Verified: Yes Diagnosis Coding Wound Cleansing Wound #1 Left Trochanter o May Shower, gently pat wound dry prior to applying new dressing. Anesthetic Wound #1 Left Trochanter o Topical Lidocaine 4% cream applied to wound bed prior to debridement - in clinic only Skin Barriers/Peri-Wound Care Wound #1 Left Trochanter o Skin Prep Primary Wound Dressing Wound #1 Left Trochanter o Iodosorb Ointment Secondary Dressing Wound #1 Left Trochanter o Dry Gauze o Boardered Foam Dressing Dressing Change Frequency Wound #1 Left Trochanter o Change dressing every other day. Follow-up Appointments Wound #1 Left Trochanter o Return Appointment in 1 week. Off-Loading Wound #1 Left Trochanter o Turn and reposition every 2 hours - Try to stay off of left hip. Additional Orders / Instructions [name], [name] ([id number]) Wound #1 Left Trochanter o Other: - Do not leave home without Oxygen. Electronic Signature(s) Signed: 02/02/2017 4:37:12 PM By: Alric Quan Signed: 02/02/2017 5:52:53 PM By: Linton Ham MD Entered By: Alric Quan on 02/02/2017 15:46:59 [name] [name] ([id number]) -------------------------------------------------------------------------------- Problem List Details Patient Name: [name] [name] Date of Service: 02/02/2017 3:30 PM Medical Record Number: [id number] Patient Account Number:  [id number] Date of Birth/Sex: September 27, 1929 (83 y.o. Male) Treating RN: Ahmed Prima Primary Care Provider: [name] Other Clinician: Referring Provider: [name] Treating Provider/Extender: Ricard Dillon Weeks in Treatment: 4 Active Problems ICD-10 Encounter Code Description Active Date Diagnosis L89.220 Pressure ulcer of left hip, unstageable 01/05/2017 Yes R63.4 Abnormal weight loss 01/05/2017 Yes J84.10 Pulmonary fibrosis, unspecified 01/05/2017 Yes Inactive Problems Resolved Problems Electronic Signature(s) Signed: 02/02/2017 5:52:53 PM By: Linton Ham MD Entered By: Linton Ham on 02/02/2017 17:47:41 [name] [name] ([id number]) -------------------------------------------------------------------------------- Progress Note Details Patient Name: [name] [name] Date of Service: 02/02/2017 3:30 PM Medical Record Number: [id number] Patient Account Number: [id number] Date of Birth/Sex: 09-Aug-1929 (83 y.o. Male) Treating RN: Ahmed Prima Primary Care Provider: [name] Other Clinician: Referring Provider: [name] Treating Provider/Extender: Tito Dine in Treatment: 4 Subjective Chief  Complaint Information obtained from Patient The patient is here for initial evaluation of his left trocanter pressure ulcer History of Present Illness (HPI) The following HPI elements were documented for the patient's wound: Location: left trocanter Quality: denies pain Duration: the ulcer was noticed two weeks ago Context: ulcer is secondary to pressure Modifying Factors: unrelieved pressure is an exacerbating factor 01/05/17- the patient is here for initial violation of a left trochanter pressure ulcer that was originally noticed by his wife a few weeks ago. He states that he traditionally sleeps on his left side. He has lost between 20 and 30 pounds of the past 6 months. He cannot articulate a specific reason for this he states that it's not a loss of  appetite. He denies getting fatigued with eating, despite having pulmonary fibrosis, COPD and being exertionally oxygen dependent. They have been applying neosporin. Lab work obtained in January revealed an albumin level 3.7. He is a non-diabetic, former smoker (quitting > 40 yrs ago) 01/12/17; left trochanter ulcer. smaller using iodosorb which was started last week. He is hypoxic. does not keep O2 on reliable at least here. When this happen P02 drops in the 70s 01/19/17; left trochanteric ulcer using Iodosorb about half the size of last week. O2 sat is 96% on 2 L respiratory status seems more stable 01/26/17 wouind measures slightly larger this week. non viable surface. Using iodosorb 02/02/17; again measurements unchanged this week. Non-viable surface. We had been using Iodosorb with some improvement I switched off the Prisma last week but I can't really understand why that was done. Objective Constitutional Supine Blood Pressure is within target range for patient.. Pulse regular and within target range for patient.Marland Kitchen Respirations regular, non-labored and within target range.. Oxygen on appears stable. [name] [name], [name] [name] ([id number]) Vitals Time Taken: 3:29 PM, Height: 71 in, Weight: 143 lbs, BMI: 19.9, Pulse: 66 bpm, Respiratory Rate: 18 breaths/min, Blood Pressure: 98/51 mmHg, Pulse Oximetry: 100 %. General Notes: pt has personal O2 via [place] General Notes: Wound exam; areas on the left greater trochanter. Again a nonviable surface. #3 curet debrided of necrotic surface. Tolerates this well. Hemostasis with direct pressure. Base of this looks very healthy post debridement and I can understand really why this reoccurs. Constance Holster I really certain why I switched off the Iodoflex last week Integumentary (Hair, Skin) Wound #1 status is Open. Original cause of wound was Pressure Injury. The wound is located on the Left Trochanter. The wound measures 0.7cm length x 0.6cm width x 0.1cm depth; 0.33cm^2 area  and 0.033cm^3 volume. The wound is limited to skin breakdown. There is no tunneling or undermining noted. There is a large amount of serous drainage noted. The wound margin is flat and intact. There is medium (34-66%) red granulation within the wound bed. There is a medium (34-66%) amount of necrotic tissue within the wound bed including Adherent Slough. The periwound skin appearance did not exhibit: Callus, Crepitus, Excoriation, Induration, Rash, Scarring, Dry/Scaly, Maceration, Atrophie Blanche, Cyanosis, Ecchymosis, Hemosiderin Staining, Mottled, Pallor, Rubor, Erythema. Assessment Active Problems ICD-10 L89.220 - Pressure ulcer of left hip, unstageable R63.4 - Abnormal weight loss J84.10 - Pulmonary fibrosis, unspecified Procedures Wound #1 Wound #1 is a Pressure Ulcer located on the Left Trochanter . There was a Skin/Subcutaneous Tissue Debridement ([id number]) debridement with total area of 0.42 sq cm performed by Ricard Dillon, MD. with the following instrument(s): Curette to remove Viable and Non-Viable tissue/material including Exudate, Fibrin/Slough, and Subcutaneous after achieving pain control using Lidocaine 4% Topical Solution. A  time out was conducted at 15:44, prior to the start of the procedure. A Minimum amount of bleeding was controlled with Pressure. The procedure was tolerated well with a pain level of 0 throughout and a pain level of 0 following the procedure. Post Debridement Measurements: 0.7cm length x 0.6cm width x 0.1cm depth; 0.033cm^3 volume. Post debridement Stage noted as Unstageable/Unclassified. Character of Wound/Ulcer Post Debridement requires further debridement. Severity of Tissue Post [name] [name], [name] [name] ([id number]) Debridement is: Fat layer exposed. Post procedure Diagnosis Wound #1: Same as Pre-Procedure Plan Wound Cleansing: Wound #1 Left Trochanter: May Shower, gently pat wound dry prior to applying new dressing. Anesthetic: Wound #1  Left Trochanter: Topical Lidocaine 4% cream applied to wound bed prior to debridement - in clinic only Skin Barriers/Peri-Wound Care: Wound #1 Left Trochanter: Skin Prep Primary Wound Dressing: Wound #1 Left Trochanter: Iodosorb Ointment Secondary Dressing: Wound #1 Left Trochanter: Dry Gauze Boardered Foam Dressing Dressing Change Frequency: Wound #1 Left Trochanter: Change dressing every other day. Follow-up Appointments: Wound #1 Left Trochanter: Return Appointment in 1 week. Off-Loading: Wound #1 Left Trochanter: Turn and reposition every 2 hours - Try to stay off of left hip. Additional Orders / Instructions: Wound #1 Left Trochanter: Other: - Do not leave home without Oxygen. #1 I went back to the Iodoflex today. We seemed to be making progress with this and I'm that just not certain why I switched the Prisma last week or this may have been a transcription error #2 he apparently has a blanchable red area on the other greater trochanter which will need to look at next week. I did not see this today [name] [name], [name] [name] ([id number]) Electronic Signature(s) Signed: 02/02/2017 5:52:53 PM By: Linton Ham MD Entered By: Linton Ham on 02/02/2017 17:51:56 [name] [name] ([id number]) -------------------------------------------------------------------------------- Donald Details Patient Name: [name] [name] Date of Service: 02/02/2017 Medical Record Number: [id number] Patient Account Number: [id number] Date of Birth/Sex: [birth year] (83 y.o. Male) Treating RN: Ahmed Prima Primary Care Provider: [name] Other Clinician: Referring Provider: [name] Treating Provider/Extender: Tito Dine in Treatment: 4 Diagnosis Coding ICD-10 Codes Code Description [phone] Pressure ulcer of left hip, unstageable R63.4 Abnormal weight loss J84.10 Pulmonary fibrosis, unspecified Facility Procedures CPT4 Code: [id number] Description: 32122 - DEB SUBQ  TISSUE 20 SQ CM/< ICD-10 Description Diagnosis L89.220 Pressure ulcer of left hip, unstageable Modifier: Quantity: 1 Physician Procedures CPT4 Code: [id number] Description: 70488 - WC PHYS SUBQ TISS 20 SQ CM ICD-10 Description Diagnosis L89.220 Pressure ulcer of left hip, unstageable Modifier: Quantity: 1 Electronic Signature(s) Signed: 02/02/2017 5:52:53 PM By: Linton Ham MD Entered By: Linton Ham on 02/02/2017 17:52:10

## 2017-02-09 ENCOUNTER — Encounter: Payer: Medicare Other | Admitting: Internal Medicine

## 2017-02-09 DIAGNOSIS — L89220 Pressure ulcer of left hip, unstageable: Secondary | ICD-10-CM | POA: Diagnosis not present

## 2017-02-11 NOTE — Progress Notes (Signed)
MARKAIL, DIEKMAN ([id number]) Visit Report for 02/09/2017 Debridement Details Patient Name: [name] [name], [name] [name] Date of Service: 02/09/2017 3:30 PM Medical Record Number: [id number] Patient Account Number: [id number] Date of Birth/Sex: [birth year] (83 y.o. Male) Treating RN: Ahmed Prima Primary Care Provider: [name] Other Clinician: Referring Provider: [name] Treating Provider/Extender: Tito Dine in Treatment: 5 Debridement Performed for Wound #1 Left Trochanter Assessment: Performed By: Physician Ricard Dillon, MD Debridement: Debridement Pre-procedure Yes - 16:00 Verification/Time Out Taken: Start Time: 16:01 Pain Control: Lidocaine 4% Topical Solution Level: Skin/Subcutaneous Tissue Total Area Debrided (L x 0.5 (cm) x 0.4 (cm) = 0.2 (cm) W): Tissue and other Viable, Non-Viable, Exudate, Fibrin/Slough, Subcutaneous material debrided: Instrument: Curette Bleeding: Minimum Hemostasis Achieved: Pressure End Time: 16:03 Procedural Pain: 0 Post Procedural Pain: 0 Response to Treatment: Procedure was tolerated well Post Debridement Measurements of Total Wound Length: (cm) 0.5 Stage: Unstageable/Unclassified Width: (cm) 0.4 Depth: (cm) 0.1 Volume: (cm) 0.016 Character of Wound/Ulcer Post Requires Further Debridement: Debridement Severity of Tissue Post Fat layer exposed Debridement: Post Procedure Diagnosis Same as Pre-procedure Electronic Signature(s) HULAN, SZUMSKI ([id number]) Signed: 02/09/2017 5:11:51 PM By: Alric Quan Signed: 02/09/2017 5:27:22 PM By: Linton Ham MD Entered By: Alric Quan on 02/09/2017 16:01:48 [name] [name] ([id number]) -------------------------------------------------------------------------------- HPI Details Patient Name: [name] [name] Date of Service: 02/09/2017 3:30 PM Medical Record Number: [id number] Patient Account Number: [id number] Date of Birth/Sex: 09/18/1929 (83 y.o.  Male) Treating RN: Ahmed Prima Primary Care Provider: [name] Other Clinician: Referring Provider: [name] Treating Provider/Extender: Tito Dine in Treatment: 5 History of Present Illness Location: left trocanter Quality: denies pain Duration: the ulcer was noticed two weeks ago Context: ulcer is secondary to pressure Modifying Factors: unrelieved pressure is an exacerbating factor HPI Description: 01/05/17- the patient is here for initial violation of a left trochanter pressure ulcer that was originally noticed by his wife a few weeks ago. He states that he traditionally sleeps on his left side. He has lost between 20 and 30 pounds of the past 6 months. He cannot articulate a specific reason for this he states that it's not a loss of appetite. He denies getting fatigued with eating, despite having pulmonary fibrosis, COPD and being exertionally oxygen dependent. They have been applying neosporin. Lab work obtained in January revealed an albumin level 3.7. He is a non-diabetic, former smoker (quitting > 40 yrs ago) 01/12/17; left trochanter ulcer. smaller using iodosorb which was started last week. He is hypoxic. does not keep O2 on reliable at least here. When this happen P02 drops in the 70s 01/19/17; left trochanteric ulcer using Iodosorb about half the size of last week. O2 sat is 96% on 2 L respiratory status seems more stable 01/26/17 wouind measures slightly larger this week. non viable surface. Using iodosorb 02/02/17; again measurements unchanged this week. Non-viable surface. We had been using Iodosorb with some improvement I switched off the Prisma last week but I can't really understand why that was done. 02/09/17; dimensions are smaller left greater trochanter. Still in nonviable surface. We've been using Iodosorb. Fortunately the stage I pressure area over the right hip has resolved. Electronic Signature(s) Signed: 02/09/2017 5:27:22 PM By: Linton Ham  MD Entered By: Linton Ham on 02/09/2017 17:19:21 [name] [name] ([id number]) -------------------------------------------------------------------------------- Physical Exam Details Patient Name: [name] [name] Date of Service: 02/09/2017 3:30 PM Medical Record Number: [id number] Patient Account Number: [id number] Date of Birth/Sex: 02-Aug-1929 (83 y.o. Male) Treating RN: Ahmed Prima Primary Care Provider:  [name] Other Clinician: Referring Provider: [name] Treating Provider/Extender: Tito Dine in Treatment: 5 Constitutional Sitting or standing Blood Pressure is within target range for patient.. Pulse regular and within target range for patient.Marland Kitchen Respirations regular, non-labored and within target range.. Temperature is normal and within the target range for the patient.. Patient's appearance is neat and clean. Appears in no acute distress. Well nourished and well developed.. Notes Wound exam; area on the left greater trochanter is smaller. Using a #3 curet I debrided necrotic material off the surface of the wound this appears to be healthy granulation underneath this. I'm going to continue the Iodoflex. Electronic Signature(s) Signed: 02/09/2017 5:27:22 PM By: Linton Ham MD Entered By: Linton Ham on 02/09/2017 17:20:20 [name] [name] ([id number]) -------------------------------------------------------------------------------- Physician Orders Details Patient Name: [name] [name] Date of Service: 02/09/2017 3:30 PM Medical Record Number: [id number] Patient Account Number: [id number] Date of Birth/Sex: May 06, 1929 (83 y.o. Male) Treating RN: Ahmed Prima Primary Care Provider: [name] Other Clinician: Referring Provider: [name] Treating Provider/Extender: Tito Dine in Treatment: 5 Verbal / Phone Orders: Yes Clinician: Carolyne Fiscal, Debi Read Back and Verified: Yes Diagnosis Coding Wound  Cleansing Wound #1 Left Trochanter o May Shower, gently pat wound dry prior to applying new dressing. Anesthetic Wound #1 Left Trochanter o Topical Lidocaine 4% cream applied to wound bed prior to debridement - in clinic only Skin Barriers/Peri-Wound Care Wound #1 Left Trochanter o Skin Prep Primary Wound Dressing Wound #1 Left Trochanter o Iodosorb Ointment Secondary Dressing Wound #1 Left Trochanter o Dry Gauze o Boardered Foam Dressing Dressing Change Frequency Wound #1 Left Trochanter o Change dressing every other day. Follow-up Appointments Wound #1 Left Trochanter o Return Appointment in 1 week. Off-Loading Wound #1 Left Trochanter o Turn and reposition every 2 hours - Try to stay off of left hip. Additional Orders / Instructions MCCOY, TESTA ([id number]) Wound #1 Left Trochanter o Other: - Do not leave home without Oxygen. Electronic Signature(s) Signed: 02/09/2017 5:11:51 PM By: Alric Quan Signed: 02/09/2017 5:27:22 PM By: Linton Ham MD Entered By: Alric Quan on 02/09/2017 16:05:25 [name] [name] ([id number]) -------------------------------------------------------------------------------- Progress Note Details Patient Name: [name] [name] Date of Service: 02/09/2017 3:30 PM Medical Record Number: [id number] Patient Account Number: [id number] Date of Birth/Sex: 08/23/1929 (83 y.o. Male) Treating RN: Ahmed Prima Primary Care Provider: [name] Other Clinician: Referring Provider: [name] Treating Provider/Extender: Tito Dine in Treatment: 5 Subjective History of Present Illness (HPI) The following HPI elements were documented for the patient's wound: Location: left trocanter Quality: denies pain Duration: the ulcer was noticed two weeks ago Context: ulcer is secondary to pressure Modifying Factors: unrelieved pressure is an exacerbating factor 01/05/17- the patient is here for initial  violation of a left trochanter pressure ulcer that was originally noticed by his wife a few weeks ago. He states that he traditionally sleeps on his left side. He has lost between 20 and 30 pounds of the past 6 months. He cannot articulate a specific reason for this he states that it's not a loss of appetite. He denies getting fatigued with eating, despite having pulmonary fibrosis, COPD and being exertionally oxygen dependent. They have been applying neosporin. Lab work obtained in January revealed an albumin level 3.7. He is a non-diabetic, former smoker (quitting > 40 yrs ago) 01/12/17; left trochanter ulcer. smaller using iodosorb which was started last week. He is hypoxic. does not keep O2 on reliable at least here. When this happen  P02 drops in the 70s 01/19/17; left trochanteric ulcer using Iodosorb about half the size of last week. O2 sat is 96% on 2 L respiratory status seems more stable 01/26/17 wouind measures slightly larger this week. non viable surface. Using iodosorb 02/02/17; again measurements unchanged this week. Non-viable surface. We had been using Iodosorb with some improvement I switched off the Prisma last week but I can't really understand why that was done. 02/09/17; dimensions are smaller left greater trochanter. Still in nonviable surface. We've been using Iodosorb. Fortunately the stage I pressure area over the right hip has resolved. Objective Constitutional Sitting or standing Blood Pressure is within target range for patient.. Pulse regular and within target range for patient.Marland Kitchen Respirations regular, non-labored and within target range.. Temperature is normal and within the target range for the patient.. Patient's appearance is neat and clean. Appears in no acute distress. Well nourished and well developed.. Vitals Time Taken: 3:39 PM, Height: 71 in, Weight: 143 lbs, BMI: 19.9, Pulse: 58 bpm, Respiratory Rate: 18 Bulluck, Carzell G. ([id number]) breaths/min, Blood  Pressure: 131/74 mmHg, Pulse Oximetry: 100 %. General Notes: Wound exam; area on the left greater trochanter is smaller. Using a #3 curet I debrided necrotic material off the surface of the wound this appears to be healthy granulation underneath this. I'm going to continue the Iodoflex. Integumentary (Hair, Skin) Wound #1 status is Open. Original cause of wound was Pressure Injury. The wound is located on the Left Trochanter. The wound measures 0.5cm length x 0.4cm width x 0.1cm depth; 0.157cm^2 area and 0.016cm^3 volume. The wound is limited to skin breakdown. There is no tunneling or undermining noted. There is a large amount of serous drainage noted. The wound margin is flat and intact. There is medium (34-66%) red granulation within the wound bed. There is a medium (34-66%) amount of necrotic tissue within the wound bed including Adherent Slough. The periwound skin appearance did not exhibit: Callus, Crepitus, Excoriation, Induration, Rash, Scarring, Dry/Scaly, Maceration, Atrophie Blanche, Cyanosis, Ecchymosis, Hemosiderin Staining, Mottled, Pallor, Rubor, Erythema. Procedures Wound #1 Wound #1 is a Pressure Ulcer located on the Left Trochanter . There was a Skin/Subcutaneous Tissue Debridement ([id number]) debridement with total area of 0.2 sq cm performed by Ricard Dillon, MD. with the following instrument(s): Curette to remove Viable and Non-Viable tissue/material including Exudate, Fibrin/Slough, and Subcutaneous after achieving pain control using Lidocaine 4% Topical Solution. A time out was conducted at 16:00, prior to the start of the procedure. A Minimum amount of bleeding was controlled with Pressure. The procedure was tolerated well with a pain level of 0 throughout and a pain level of 0 following the procedure. Post Debridement Measurements: 0.5cm length x 0.4cm width x 0.1cm depth; 0.016cm^3 volume. Post debridement Stage noted as Unstageable/Unclassified. Character of  Wound/Ulcer Post Debridement requires further debridement. Severity of Tissue Post Debridement is: Fat layer exposed. Post procedure Diagnosis Wound #1: Same as Pre-Procedure Plan Wound Cleansing: Wound #1 Left Trochanter: May Shower, gently pat wound dry prior to applying new dressing. Anesthetic: Wound #1 Left Trochanter: Topical Lidocaine 4% cream applied to wound bed prior to debridement - in clinic only TANVIR, HIPPLE ([id number]) Skin Barriers/Peri-Wound Care: Wound #1 Left Trochanter: Skin Prep Primary Wound Dressing: Wound #1 Left Trochanter: Iodosorb Ointment Secondary Dressing: Wound #1 Left Trochanter: Dry Gauze Boardered Foam Dressing Dressing Change Frequency: Wound #1 Left Trochanter: Change dressing every other day. Follow-up Appointments: Wound #1 Left Trochanter: Return Appointment in 1 week. Off-Loading: Wound #1 Left Trochanter: Turn and  reposition every 2 hours - Try to stay off of left hip. Additional Orders / Instructions: Wound #1 Left Trochanter: Other: - Do not leave home without Oxygen. #1 I'm going to continue with the Iodoflex. The patient has a very adherent surface to this wound almost every week. The Iodoflex seems to help with this. When I changed the Prisma there was a significant deterioration Electronic Signature(s) Signed: 02/09/2017 5:27:22 PM By: Linton Ham MD Entered By: Linton Ham on 02/09/2017 17:21:18 [name] [name] ([id number]) -------------------------------------------------------------------------------- [place] Details Patient Name: [name] [name] Date of Service: 02/09/2017 Medical Record Number: [id number] Patient Account Number: [id number] Date of Birth/Sex: Jul 14, 1929 (83 y.o. Male) Treating RN: Ahmed Prima Primary Care Provider: [name] Other Clinician: Referring Provider: [name] Treating Provider/Extender: Tito Dine in Treatment: 5 Diagnosis Coding ICD-10  Codes Code Description [phone] Pressure ulcer of left hip, unstageable R63.4 Abnormal weight loss J84.10 Pulmonary fibrosis, unspecified Facility Procedures CPT4 Code: [id number] Description: 56256 - DEB SUBQ TISSUE 20 SQ CM/< ICD-10 Description Diagnosis L89.220 Pressure ulcer of left hip, unstageable Modifier: Quantity: 1 Physician Procedures CPT4 Code: [id number] Description: 28768 - WC PHYS SUBQ TISS 20 SQ CM ICD-10 Description Diagnosis L89.220 Pressure ulcer of left hip, unstageable Modifier: Quantity: 1 Electronic Signature(s) Signed: 02/09/2017 5:27:22 PM By: Linton Ham MD Entered By: Linton Ham on 02/09/2017 17:21:31

## 2017-02-11 NOTE — Progress Notes (Signed)
[name] [name], [name] [name] ([id number]) Visit Report for 02/09/2017 Arrival Information Details Patient Name: [name] [name], [name] [name] Date of Service: 02/09/2017 3:30 PM Medical Record Number: [id number] Patient Account Number: [id number] Date of Birth/Sex: May 13, 1929 (83 y.o. Male) Treating RN: Ahmed Prima Primary Care Sharone Almond: [name] Other Clinician: Referring Sabreen Kitchen: [name] Treating Shakeira Rhee/Extender: Tito Dine in Treatment: 5 Visit Information History Since Last Visit All ordered tests and consults were completed: No Patient Arrived: [name] [name] Added or deleted any medications: No Arrival Time: 15:38 Any new allergies or adverse reactions: No Accompanied By: caregiver Had a fall or experienced change in No Transfer Assistance: EasyPivot Patient activities of daily living that may affect Lift risk of falls: Patient Identification Verified: Yes Hospitalized since last visit: No Secondary Verification Process Yes Has Dressing in Place as Prescribed: Yes Completed: Pain Present Now: No Patient Requires Transmission- No Based Precautions: Patient Has Alerts: Yes Patient Alerts: Patient on Blood Thinner Warfarin NOT Diabetic Electronic Signature(s) Signed: 02/09/2017 5:11:51 PM By: Alric Quan Entered By: Alric Quan on 02/09/2017 15:39:10 [name] [name] ([id number]) -------------------------------------------------------------------------------- Encounter Discharge Information Details Patient Name: [name] [name] Date of Service: 02/09/2017 3:30 PM Medical Record Number: [id number] Patient Account Number: [id number] Date of Birth/Sex: 08-22-1929 (83 y.o. Male) Treating RN: Ahmed Prima Primary Care Laneya Gasaway: [name] Other Clinician: Referring Zohaib Heeney: [name] Treating Dyllan Hughett/Extender: Tito Dine in Treatment: 5 Encounter Discharge Information Items Discharge Pain Level: 0 Discharge Condition:  Stable Ambulatory Status: Walker Discharge Destination: Home Transportation: Private Auto Accompanied By: caregiver Schedule Follow-up Appointment: Yes Medication Reconciliation completed No and provided to Patient/Care Eria Lozoya: Provided on Clinical Summary of Care: 02/09/2017 Form Type Recipient Paper Patient CG Electronic Signature(s) Signed: 02/09/2017 4:06:40 PM By: Ruthine Dose Entered By: Ruthine Dose on 02/09/2017 16:06:40 [name] [name] ([id number]) -------------------------------------------------------------------------------- Lower Extremity Assessment Details Patient Name: [name] [name] Date of Service: 02/09/2017 3:30 PM Medical Record Number: [id number] Patient Account Number: [id number] Date of Birth/Sex: 1929/05/03 (83 y.o. Male) Treating RN: Ahmed Prima Primary Care Annalyn Blecher: [name] Other Clinician: Referring Lemarcus Baggerly: [name] Treating Johnathin Vanderschaaf/Extender: Ricard Dillon Weeks in Treatment: 5 Electronic Signature(s) Signed: 02/09/2017 5:11:51 PM By: Alric Quan Entered By: Alric Quan on 02/09/2017 15:46:03 [name] [name] ([id number]) -------------------------------------------------------------------------------- Multi Wound Chart Details Patient Name: [name] [name] Date of Service: 02/09/2017 3:30 PM Medical Record Number: [id number] Patient Account Number: [id number] Date of Birth/Sex: 02-19-1929 (83 y.o. Male) Treating RN: Ahmed Prima Primary Care Desta Bujak: [name] Other Clinician: Referring Esther Bradstreet: [name] Treating Davontay Watlington/Extender: Tito Dine in Treatment: 5 Vital Signs Height(in): 71 Pulse(bpm): 58 Weight(lbs): 143 Blood Pressure 131/74 (mmHg): Body Mass Index(BMI): 20 Temperature(F): Respiratory Rate 18 (breaths/min): Photos: [1:No Photos] [N/A:N/A] Wound Location: [1:Left Trochanter] [N/A:N/A] Wounding Event: [1:Pressure Injury] [N/A:N/A] Primary Etiology:  [1:Pressure Ulcer] [N/A:N/A] Comorbid History: [1:Cataracts, Chronic Obstructive Pulmonary Disease (COPD), Arrhythmia, Congestive Heart Failure, Coronary Artery Disease, Hypotension, Myocardial Infarction, Gout, Osteoarthritis] [N/A:N/A] Date Acquired: [1:12/22/2016] [N/A:N/A] Weeks of Treatment: [1:5] [N/A:N/A] Wound Status: [1:Open] [N/A:N/A] Measurements L x W x D 0.5x0.4x0.1 [N/A:N/A] (cm) Area (cm) : [1:0.157] [N/A:N/A] Volume (cm) : [1:0.016] [N/A:N/A] % Reduction in Area: [1:52.40%] [N/A:N/A] % Reduction in Volume: 51.50% [N/A:N/A] Classification: [1:Unstageable/Unclassified] [N/A:N/A] Exudate Amount: [1:Large] [N/A:N/A] Exudate Type: [1:Serous] [N/A:N/A] Exudate Color: [1:amber] [N/A:N/A] Wound Margin: [1:Flat and Intact] [N/A:N/A] Granulation Amount: [1:Medium (34-66%)] [N/A:N/A] Granulation Quality: [1:Red] [N/A:N/A] Necrotic Amount: [1:Medium (34-66%)] [N/A:N/A] Exposed Structures: Fascia: No N/A N/A Fat Layer (Subcutaneous Tissue) Exposed: No Tendon: No Muscle: No Joint: No Bone: No  Limited to Skin Breakdown Epithelialization: Small (1-33%) N/A N/A Periwound Skin Texture: Excoriation: No N/A N/A Induration: No Callus: No Crepitus: No Rash: No Scarring: No Periwound Skin Maceration: No N/A N/A Moisture: Dry/Scaly: No Periwound Skin Color: Atrophie Blanche: No N/A N/A Cyanosis: No Ecchymosis: No Erythema: No Hemosiderin Staining: No Mottled: No Pallor: No Rubor: No Tenderness on No N/A N/A Palpation: Wound Preparation: Ulcer Cleansing: N/A N/A Rinsed/Irrigated with Saline Topical Anesthetic Applied: Other: lidocaine 4% Treatment Notes Electronic Signature(s) Signed: 02/09/2017 5:11:51 PM By: Alric Quan Entered By: Alric Quan on 02/09/2017 15:48:25 [name] [name] ([id number]) -------------------------------------------------------------------------------- [place] Details Patient Name: [name] [name] Date of Service: 02/09/2017 3:30 PM Medical Record Number: [id number] Patient Account Number: [id number] Date of Birth/Sex: 1929-06-13 (83 y.o. Male) Treating RN: Ahmed Prima Primary Care Cassell Voorhies: [name] Other Clinician: Referring Kelin Borum: [name] Treating Maximo Spratling/Extender: Tito Dine in Treatment: 5 Active Inactive ` Abuse / Safety / Falls / Self Care Management Nursing Diagnoses: Impaired physical mobility Potential for falls Goals: Patient/caregiver will verbalize understanding of skin care regimen Date Initiated: 01/05/2017 Target Resolution Date: 03/29/2017 Goal Status: Active Patient/caregiver will verbalize/demonstrate measures taken to prevent injury and/or falls Date Initiated: 01/05/2017 Target Resolution Date: 03/29/2017 Goal Status: Active Interventions: Assess fall risk on admission and as needed Assess self care needs on admission and as needed Notes: ` Orientation to the Wound Care Program Nursing Diagnoses: Knowledge deficit related to the wound healing center program Goals: Patient/caregiver will verbalize understanding of the [facility] Date Initiated: 01/05/2017 Target Resolution Date: 03/29/2017 Goal Status: Active Interventions: Provide education on orientation to the wound center Notes: [name] [name], [name] [name] ([id number]) Pressure Nursing Diagnoses: Knowledge deficit related to management of pressures ulcers Goals: Patient will remain free of pressure ulcers Date Initiated: 01/05/2017 Target Resolution Date: 03/29/2017 Goal Status: Active Patient/caregiver will verbalize understanding of pressure ulcer management Date Initiated: 01/05/2017 Target Resolution Date: 03/29/2017 Goal Status: Active Interventions: Assess offloading mechanisms upon admission and as needed Notes: ` Wound/Skin Impairment Nursing Diagnoses: Knowledge deficit related to ulceration/compromised skin integrity Goals: Ulcer/skin  breakdown will heal within 14 weeks Date Initiated: 01/05/2017 Target Resolution Date: 04/06/2017 Goal Status: Active Interventions: Assess patient/caregiver ability to perform ulcer/skin care regimen upon admission and as needed Assess ulceration(s) every visit Treatment Activities: Skin care regimen initiated : 01/05/2017 Topical wound management initiated : 01/05/2017 Notes: Electronic Signature(s) Signed: 02/09/2017 5:11:51 PM By: Alric Quan Entered By: Alric Quan on 02/09/2017 15:48:18 [name] [name] ([id number]) -------------------------------------------------------------------------------- Pain Assessment Details Patient Name: [name] [name] Date of Service: 02/09/2017 3:30 PM Medical Record Number: [id number] Patient Account Number: [id number] Date of Birth/Sex: July 31, 1929 (83 y.o. Male) Treating RN: Ahmed Prima Primary Care Adelin Ventrella: [name] Other Clinician: Referring Marcial Pless: [name] Treating Tenesha Garza/Extender: Tito Dine in Treatment: 5 Active Problems Location of Pain Severity and Description of Pain Patient Has Paino No Site Locations With Dressing Change: No Pain Management and Medication Current Pain Management: Electronic Signature(s) Signed: 02/09/2017 5:11:51 PM By: Alric Quan Entered By: Alric Quan on 02/09/2017 15:39:16 [name] [name] ([id number]) -------------------------------------------------------------------------------- Patient/Caregiver Education Details Patient Name: [name] [name] Date of Service: 02/09/2017 3:30 PM Medical Record Number: [id number] Patient Account Number: [id number] Date of Birth/Gender: 12/26/1928 (83 y.o. Male) Treating RN: Ahmed Prima Primary Care Physician: [name] Other Clinician: Referring Physician: [name] Treating Physician/Extender: Tito Dine in Treatment: 5 Education Assessment Education Provided To: Patient and  Caregiver Education Topics Provided Wound/Skin Impairment: Handouts:  Other: change dressing as ordered Methods: Demonstration, Explain/Verbal Responses: State content correctly Electronic Signature(s) Signed: 02/09/2017 5:11:51 PM By: Alric Quan Entered By: Alric Quan on 02/09/2017 15:48:55 [name] [name] ([id number]) -------------------------------------------------------------------------------- Wound Assessment Details Patient Name: [name] [name] Date of Service: 02/09/2017 3:30 PM Medical Record Number: [id number] Patient Account Number: [id number] Date of Birth/Sex: January 19, 1929 (83 y.o. Male) Treating RN: Ahmed Prima Primary Care Kadesha Virrueta: [name] Other Clinician: Referring Tukker Byrns: [name] Treating Meko Masterson/Extender: Ricard Dillon Weeks in Treatment: 5 Wound Status Wound Number: 1 Primary Pressure Ulcer Etiology: Wound Location: Left Trochanter Wound Open Wounding Event: Pressure Injury Status: Date Acquired: 12/22/2016 Comorbid Cataracts, Chronic Obstructive Weeks Of Treatment: 5 History: Pulmonary Disease (COPD), Arrhythmia, Clustered Wound: No Congestive Heart Failure, Coronary Artery Disease, Hypotension, Myocardial Infarction, Gout, Osteoarthritis Photos Photo Uploaded By: Alric Quan on 02/09/2017 15:49:44 Wound Measurements Length: (cm) 0.5 Width: (cm) 0.4 Depth: (cm) 0.1 Area: (cm) 0.157 Volume: (cm) 0.016 % Reduction in Area: 52.4% % Reduction in Volume: 51.5% Epithelialization: Small (1-33%) Tunneling: No Undermining: No Wound Description Classification: Unstageable/Unclassified Wound Margin: Flat and Intact Exudate Amount: Large Exudate Type: Serous Exudate Color: amber Foul Odor After Cleansing: No Slough/Fibrino Yes Wound Bed Granulation Amount: Medium (34-66%) Exposed Structure Granulation Quality: Red Fascia Exposed: No [name] [name], [name] [name] ([id number]) Necrotic Amount: Medium (34-66%) Fat  Layer (Subcutaneous Tissue) Exposed: No Necrotic Quality: Adherent Slough Tendon Exposed: No Muscle Exposed: No Joint Exposed: No Bone Exposed: No Limited to Skin Breakdown Periwound Skin Texture Texture Color No Abnormalities Noted: No No Abnormalities Noted: No Callus: No Atrophie Blanche: No Crepitus: No Cyanosis: No Excoriation: No Ecchymosis: No Induration: No Erythema: No Rash: No Hemosiderin Staining: No Scarring: No Mottled: No Pallor: No Moisture Rubor: No No Abnormalities Noted: No Dry / Scaly: No Maceration: No Wound Preparation Ulcer Cleansing: Rinsed/Irrigated with Saline Topical Anesthetic Applied: Other: lidocaine 4%, Treatment Notes Wound #1 (Left Trochanter) 1. Cleansed with: Clean wound with Normal Saline 2. Anesthetic Topical Lidocaine 4% cream to wound bed prior to debridement 3. Peri-wound Care: Skin Prep 4. Dressing Applied: Iodosorb Ointment 5. Secondary Dressing Applied Bordered Foam Dressing Dry Gauze Electronic Signature(s) Signed: 02/09/2017 5:11:51 PM By: Alric Quan Entered By: Alric Quan on 02/09/2017 15:45:26 [name] [name] ([id number]) -------------------------------------------------------------------------------- [place] Details Patient Name: [name] [name] Date of Service: 02/09/2017 3:30 PM Medical Record Number: [id number] Patient Account Number: [id number] Date of Birth/Sex: 06-01-1929 (83 y.o. Male) Treating RN: Ahmed Prima Primary Care Kittie Krizan: [name] Other Clinician: Referring Mannix Kroeker: [name] Treating Veronica Guerrant/Extender: Tito Dine in Treatment: 5 Vital Signs Time Taken: 15:39 Pulse (bpm): 58 Height (in): 71 Respiratory Rate (breaths/min): 18 Weight (lbs): 143 Blood Pressure (mmHg): 131/74 Body Mass Index (BMI): 19.9 Reference Range: 80 - 120 mg / dl Pulse Oximetry (%): 100 Electronic Signature(s) Signed: 02/09/2017 5:11:51 PM By: Alric Quan Entered By:  Alric Quan on 02/09/2017 15:46:12

## 2017-02-15 ENCOUNTER — Encounter: Payer: Medicare Other | Admitting: Internal Medicine

## 2017-02-15 DIAGNOSIS — L89220 Pressure ulcer of left hip, unstageable: Secondary | ICD-10-CM | POA: Diagnosis not present

## 2017-02-16 NOTE — Progress Notes (Signed)
DERAL, SCHELLENBERG ([id number]) Visit Report for 02/15/2017 Arrival Information Details Patient Name: [name] [name], [name] [name] Date of Service: 02/15/2017 12:30 PM Medical Record Number: [id number] Patient Account Number: [id number] Date of Birth/Sex: Dec 24, 1928 (83 y.o. Male) Treating RN: [name] Primary Care Joette Schmoker: [name] Other Clinician: Referring Alyah Boehning: [name] Treating Peggi Yono/Extender: Tito Dine in Treatment: 5 Visit Information History Since Last Visit Added or deleted any medications: No Patient Arrived: Gilford Rile Any new allergies or adverse reactions: No Arrival Time: 12:40 Had a fall or experienced change in No Accompanied By: wife activities of daily living that may affect Transfer Assistance: None risk of falls: Patient Identification Verified: Yes Signs or symptoms of abuse/neglect since last No Secondary Verification Process Yes visito Completed: Hospitalized since last visit: No Patient Requires Transmission- No Has Dressing in Place as Prescribed: Yes Based Precautions: Pain Present Now: No Patient Has Alerts: Yes Patient Alerts: Patient on Blood Thinner Warfarin NOT Diabetic Electronic Signature(s) Signed: 02/15/2017 5:19:45 PM By: [name], RN, BSN, [name] RN, BSN Entered By: [name], RN, BSN, [name] on 02/15/2017 12:40:50 [name] [name] ([id number]) -------------------------------------------------------------------------------- Clinic Level of Care Assessment Details Patient Name: [name] [name] Date of Service: 02/15/2017 12:30 PM Medical Record Number: [id number] Patient Account Number: [id number] Date of Birth/Sex: [birth year] (83 y.o. Male) Treating RN: [name] Primary Care Kuper Rennels: [name] Other Clinician: Referring Kristofor Michalowski: [name] Treating Kris Burd/Extender: Tito Dine in Treatment: 5 Clinic Level of Care Assessment Items TOOL 4 Quantity Score _0  - Use when only an EandM is performed on  FOLLOW-UP visit 0 ASSESSMENTS - Nursing Assessment / Reassessment _1  - Reassessment of Co-morbidities (includes updates in patient status) 0 X - Reassessment of Adherence to Treatment Plan 1 5 ASSESSMENTS - Wound and Skin Assessment / Reassessment X - Simple Wound Assessment / Reassessment - one wound 1 5 _2  - Complex Wound Assessment / Reassessment - multiple wounds 0 _3  - Dermatologic / Skin Assessment (not related to wound area) 0 ASSESSMENTS - Focused Assessment _4  - Circumferential Edema Measurements - multi extremities 0 _5  - Nutritional Assessment / Counseling / Intervention 0 _6  - Lower Extremity Assessment (monofilament, tuning fork, pulses) 0 _7  - Peripheral Arterial Disease Assessment (using hand held doppler) 0 ASSESSMENTS - Ostomy and/or Continence Assessment and Care _8  - Incontinence Assessment and Management 0 _9  - Ostomy Care Assessment and Management (repouching, etc.) 0 PROCESS - Coordination of Care X - Simple Patient / Family Education for ongoing care 1 15 _10  - Complex (extensive) Patient / Family Education for ongoing care 0 _11  - Staff obtains Programmer, systems, Records, Test Results / Process Orders 0 _12  - Staff telephones HHA, Nursing Homes / Clarify orders / etc 0 _13  - Routine Transfer to another Facility (non-emergent condition) 0 WILLE, AUBUCHON ([id number]) _14  - Routine Hospital Admission (non-emergent condition) 0 _15  - New Admissions / Biomedical engineer / Ordering NPWT, Apligraf, etc. 0 _16  - Emergency Hospital Admission (emergent condition) 0 X - Simple Discharge Coordination 1 10 _17  - Complex (extensive) Discharge Coordination 0 PROCESS - Special Needs _18  - Pediatric / Minor Patient Management 0 _19  - Isolation Patient Management 0 _20  - Hearing / Language / Visual special needs 0 _21  - Assessment of Community assistance (transportation, D/C planning, etc.) 0 _22  - Additional assistance / Altered mentation 0 _23  - Support Surface(s) Assessment (bed,  cushion, seat, etc.) 0 INTERVENTIONS - Wound Cleansing / Measurement X - Simple Wound Cleansing - one wound 1 5 _24  - Complex Wound Cleansing - multiple  wounds 0 X - Wound Imaging (photographs - any number of wounds) 1 5 _0  - Wound Tracing (instead of photographs) 0 X - Simple Wound Measurement - one wound 1 5 _1  - Complex Wound Measurement - multiple wounds 0 INTERVENTIONS - Wound Dressings X - Small Wound Dressing one or multiple wounds 1 10 _2  - Medium Wound Dressing one or multiple wounds 0 _3  - Large Wound Dressing one or multiple wounds 0 <ZOXWRUEAVWUJWJXB>_1<\/YNWGNFAOZHYQMVHQ>_4  - Application of Medications - topical 0 <ONGEXBMWUXLKGMWN>_0<\/UVOZDGUYQIHKVQQV>_9  - Application of Medications - injection 0 INTERVENTIONS - Miscellaneous _6  - External ear exam 0 SAAD, [facility] ([id number]) _7  - Specimen Collection (cultures, biopsies, blood, body fluids, etc.) 0 _8  - Specimen(s) / Culture(s) sent or taken to Lab for analysis 0 _9  - Patient Transfer (multiple staff / Civil Service fast streamer / Similar devices) 0 _10  - Simple Staple / Suture removal (25 or less) 0 _11  - Complex Staple / Suture removal (26 or more) 0 _12  - Hypo / Hyperglycemic Management (close monitor of Blood Glucose) 0 _13  - Ankle / Brachial Index (ABI) - do not check if billed separately 0 X - Vital Signs 1 5 Has the patient been seen at the hospital within the last three years: Yes Total Score: 65 Level Of Care: New/Established - Level 2 Electronic Signature(s) Signed: 02/15/2017 5:19:45 PM By: [name], RN, BSN, [name] RN, BSN Entered By: [name], RN, BSN, [name] on 02/15/2017 13:09:24 [name] [name] ([id number]) -------------------------------------------------------------------------------- Encounter Discharge Information Details Patient Name: [name] [name] Date of Service: 02/15/2017 12:30 PM Medical Record Number: [id number] Patient Account Number: [id number] Date of Birth/Sex: December 10, 1928 (83 y.o. Male) Treating RN: [name] Primary Care Dearl Rudden: [name] Other Clinician: Referring  Celene Pippins: [name] Treating Leannah Guse/Extender: Tito Dine in Treatment: 5 Encounter Discharge Information Items Discharge Pain Level: 0 Discharge Condition: Stable Ambulatory Status: Walker Discharge Destination: Home Transportation: Private Auto Accompanied By: wife Schedule Follow-up Appointment: Yes Medication Reconciliation completed Yes and provided to Patient/Care Nasri Boakye: Provided on Clinical Summary of Care: 02/15/2017 Form Type Recipient Paper Patient CG Electronic Signature(s) Signed: 02/15/2017 5:19:45 PM By: [name] RN, BSN, [name] RN, BSN Previous Signature: 02/15/2017 1:09:57 PM Version By: Ruthine Dose Entered By: [name] RN, BSN, [name] on 02/15/2017 13:10:19 [name] [name] ([id number]) -------------------------------------------------------------------------------- Lower Extremity Assessment Details Patient Name: [name] [name] Date of Service: 02/15/2017 12:30 PM Medical Record Number: [id number] Patient Account Number: [id number] Date of Birth/Sex: 01-Jan-1929 (83 y.o. Male) Treating RN: [name] Primary Care Ndia Sampath: [name] Other Clinician: Referring Allyna Pittsley: [name] Treating Cowen Pesqueira/Extender: Tito Dine in Treatment: 5 Electronic Signature(s) Signed: 02/15/2017 5:19:45 PM By: [name], RN, BSN, [name] RN, BSN Entered By: [name], RN, BSN, [name] on 02/15/2017 12:49:18 [name] [name] ([id number]) -------------------------------------------------------------------------------- Multi Wound Chart Details Patient Name: [name] [name] Date of Service: 02/15/2017 12:30 PM Medical Record Number: [id number] Patient Account Number: [id number] Date of Birth/Sex: [birth year] (83 y.o. Male) Treating RN: [name] Primary Care Hyde Sires: [name] Other Clinician: Referring Jamel Holzmann: [name] Treating Emma-Lee Oddo/Extender: Tito Dine in Treatment: 5 Vital Signs Height(in): 71 Pulse(bpm): 80 Weight(lbs):  143 Blood Pressure 88/44 (mmHg): Body Mass Index(BMI): 20 Temperature(F): Respiratory Rate 18 (breaths/min): Photos: [N/A:N/A] Wound Location: Left Trochanter N/A N/A Wounding Event: Pressure Injury N/A N/A Primary Etiology: Pressure Ulcer N/A N/A Comorbid History: Cataracts, Chronic N/A N/A Obstructive Pulmonary Disease (COPD), Arrhythmia, Congestive Heart Failure, Coronary Artery Disease, Hypotension, Myocardial Infarction, Gout, Osteoarthritis Date Acquired: 12/22/2016 N/A N/A Weeks of Treatment: 5 N/A N/A Wound Status:  Open N/A N/A Measurements L x W x D 0.4x0.4x0.1 N/A N/A (cm) Area (cm) : 0.126 N/A N/A Volume (cm) : 0.013 N/A N/A % Reduction in Area: 61.80% N/A N/A % Reduction in Volume: 60.60% N/A N/A Classification: Unstageable/Unclassified N/A N/A Exudate Amount: Large N/A N/A Exudate Type: Serous N/A N/A Exudate Color: amber N/A N/A MERRELL, BORSUK ([id number]) Wound Margin: Flat and Intact N/A N/A Granulation Amount: None Present (0%) N/A N/A Necrotic Amount: Large (67-100%) N/A N/A Exposed Structures: Fascia: No N/A N/A Fat Layer (Subcutaneous Tissue) Exposed: No Tendon: No Muscle: No Joint: No Bone: No Limited to Skin Breakdown Epithelialization: None N/A N/A Periwound Skin Texture: Excoriation: No N/A N/A Induration: No Callus: No Crepitus: No Rash: No Scarring: No Periwound Skin Maceration: No N/A N/A Moisture: Dry/Scaly: No Periwound Skin Color: Atrophie Blanche: No N/A N/A Cyanosis: No Ecchymosis: No Erythema: No Hemosiderin Staining: No Mottled: No Pallor: No Rubor: No Tenderness on No N/A N/A Palpation: Wound Preparation: Ulcer Cleansing: N/A N/A Rinsed/Irrigated with Saline Topical Anesthetic Applied: Other: lidocaine 4% Treatment Notes Wound #1 (Left Trochanter) 1. Cleansed with: Clean wound with Normal Saline 2. Anesthetic Topical Lidocaine 4% cream to wound bed prior to debridement 4. Dressing  Applied: Iodosorb Ointment 5. Secondary Dressing Applied Bordered Foam Dressing MARLYN, RABINE ([id number]) Electronic Signature(s) Signed: 02/15/2017 4:47:06 PM By: Linton Ham MD Entered By: Linton Ham on 02/15/2017 14:23:00 [name] [name] ([id number]) -------------------------------------------------------------------------------- [place] Details Patient Name: CHEE, KINSLOW Date of Service: 02/15/2017 12:30 PM Medical Record Number: [id number] Patient Account Number: [id number] Date of Birth/Sex: [birth year] (83 y.o. Male) Treating RN: [name] Primary Care Jontay Maston: [name] Other Clinician: Referring Eiley Mcginnity: [name] Treating Arshad Oberholzer/Extender: Tito Dine in Treatment: 5 Active Inactive ` Abuse / Safety / Falls / Self Care Management Nursing Diagnoses: Impaired physical mobility Potential for falls Goals: Patient/caregiver will verbalize understanding of skin care regimen Date Initiated: 01/05/2017 Target Resolution Date: 03/29/2017 Goal Status: Active Patient/caregiver will verbalize/demonstrate measures taken to prevent injury and/or falls Date Initiated: 01/05/2017 Target Resolution Date: 03/29/2017 Goal Status: Active Interventions: Assess fall risk on admission and as needed Assess self care needs on admission and as needed Notes: ` Orientation to the Wound Care Program Nursing Diagnoses: Knowledge deficit related to the wound healing center program Goals: Patient/caregiver will verbalize understanding of the [place] Program Date Initiated: 01/05/2017 Target Resolution Date: 03/29/2017 Goal Status: Active Interventions: Provide education on orientation to the wound center Notes: JARETH, PARDEE ([id number]) Pressure Nursing Diagnoses: Knowledge deficit related to management of pressures ulcers Goals: Patient will remain free of pressure ulcers Date Initiated: 01/05/2017 Target  Resolution Date: 03/29/2017 Goal Status: Active Patient/caregiver will verbalize understanding of pressure ulcer management Date Initiated: 01/05/2017 Target Resolution Date: 03/29/2017 Goal Status: Active Interventions: Assess offloading mechanisms upon admission and as needed Notes: ` Wound/Skin Impairment Nursing Diagnoses: Knowledge deficit related to ulceration/compromised skin integrity Goals: Ulcer/skin breakdown will heal within 14 weeks Date Initiated: 01/05/2017 Target Resolution Date: 04/06/2017 Goal Status: Active Interventions: Assess patient/caregiver ability to perform ulcer/skin care regimen upon admission and as needed Assess ulceration(s) every visit Treatment Activities: Skin care regimen initiated : 01/05/2017 Topical wound management initiated : 01/05/2017 Notes: Electronic Signature(s) Signed: 02/15/2017 5:19:45 PM By: [name], RN, BSN, [name] RN, BSN Entered By: [name], RN, BSN, [name] on 02/15/2017 12:56:33 [name] [name] ([id number]) -------------------------------------------------------------------------------- Pain Assessment Details Patient Name: [name] [name] Date of Service: 02/15/2017 12:30 PM Medical Record Number: [id number] Patient Account Number:  [id number] Date of Birth/Sex: 10/12/1928 (83 y.o. Male) Treating RN: [name] Primary Care Ommie Degeorge: [name] Other Clinician: Referring Anique Beckley: [name] Treating Oluwanifemi Petitti/Extender: Tito Dine in Treatment: 5 Active Problems Location of Pain Severity and Description of Pain Patient Has Paino No Site Locations With Dressing Change: No Pain Management and Medication Current Pain Management: Electronic Signature(s) Signed: 02/15/2017 5:19:45 PM By: [name], RN, BSN, [name] RN, BSN Entered By: [name], RN, BSN, [name] on 02/15/2017 12:41:02 [name] [name] ([id number]) -------------------------------------------------------------------------------- Patient/Caregiver Education  Details Patient Name: [name] [name] Date of Service: 02/15/2017 12:30 PM Medical Record Number: [id number] Patient Account Number: [id number] Date of Birth/Gender: 06-Nov-1928 (83 y.o. Male) Treating RN: [name] Primary Care Physician: [name] Other Clinician: Referring Physician: [name] Treating Physician/Extender: Tito Dine in Treatment: 5 Education Assessment Education Provided To: Patient Education Topics Provided Wound/Skin Impairment: Handouts: Caring for Your Ulcer, Other: continue wound care as prescribed Methods: Demonstration Responses: State content correctly Electronic Signature(s) Signed: 02/15/2017 5:19:45 PM By: [name], RN, BSN, [name] RN, BSN Entered By: [name], RN, BSN, [name] on 02/15/2017 13:10:39 [name] [name] ([id number]) -------------------------------------------------------------------------------- Wound Assessment Details Patient Name: [name] [name] Date of Service: 02/15/2017 12:30 PM Medical Record Number: [id number] Patient Account Number: [id number] Date of Birth/Sex: [birth year] (83 y.o. Male) Treating RN: [name] Primary Care Mickenzie Stolar: [name] Other Clinician: Referring Nan Maya: [name] Treating Ramone Gander/Extender: Tito Dine in Treatment: 5 Wound Status Wound Number: 1 Primary Pressure Ulcer Etiology: Wound Location: Left Trochanter Wound Open Wounding Event: Pressure Injury Status: Date Acquired: 12/22/2016 Comorbid Cataracts, Chronic Obstructive Weeks Of Treatment: 5 History: Pulmonary Disease (COPD), Arrhythmia, Clustered Wound: No Congestive Heart Failure, Coronary Artery Disease, Hypotension, Myocardial Infarction, Gout, Osteoarthritis Photos Photo Uploaded By: [name], RN, BSN, [name] on 02/15/2017 13:18:15 Wound Measurements Length: (cm) 0.4 Width: (cm) 0.4 Depth: (cm) 0.1 Area: (cm) 0.126 Volume: (cm) 0.013 % Reduction in Area: 61.8% % Reduction in Volume:  60.6% Epithelialization: None Tunneling: No Undermining: No Wound Description Classification: Unstageable/Unclassified Wound Margin: Flat and Intact Exudate Amount: Large Exudate Type: Serous Exudate Color: amber Foul Odor After Cleansing: No Slough/Fibrino Yes Wound Bed Granulation Amount: None Present (0%) Exposed Structure Necrotic Amount: Large (67-100%) Fascia Exposed: No Necrotic Quality: Adherent Slough Fat Layer (Subcutaneous Tissue) Exposed: No Tendon Exposed: No AUNDREY, ELAHI ([id number]) Muscle Exposed: No Joint Exposed: No Bone Exposed: No Limited to Skin Breakdown Periwound Skin Texture Texture Color No Abnormalities Noted: No No Abnormalities Noted: No Callus: No Atrophie Blanche: No Crepitus: No Cyanosis: No Excoriation: No Ecchymosis: No Induration: No Erythema: No Rash: No Hemosiderin Staining: No Scarring: No Mottled: No Pallor: No Moisture Rubor: No No Abnormalities Noted: No Dry / Scaly: No Maceration: No Wound Preparation Ulcer Cleansing: Rinsed/Irrigated with Saline Topical Anesthetic Applied: Other: lidocaine 4%, Treatment Notes Wound #1 (Left Trochanter) 1. Cleansed with: Clean wound with Normal Saline 2. Anesthetic Topical Lidocaine 4% cream to wound bed prior to debridement 4. Dressing Applied: Iodosorb Ointment 5. Secondary Dressing Applied Bordered Foam Dressing Electronic Signature(s) Signed: 02/15/2017 5:19:45 PM By: [name], RN, BSN, [name] RN, BSN Entered By: [name], RN, BSN, [name] on 02/15/2017 12:46:14 [name] [name] ([id number]) -------------------------------------------------------------------------------- [place] Details Patient Name: [name] [name] Date of Service: 02/15/2017 12:30 PM Medical Record Number: [id number] Patient Account Number: [id number] Date of Birth/Sex: 1929/01/14 (83 y.o. Male) Treating RN: [name] Primary Care Mann Skaggs: [name] Other Clinician: Referring Ram Haugan: [name] Treating Milan Perkins/Extender: Ricard Dillon Weeks in Treatment: 5 Vital  Signs Time Taken: 12:50 Pulse (bpm): 80 Height (in): 71 Respiratory Rate (breaths/min): 18 Weight (lbs): 143 Blood Pressure (mmHg): 88/44 Body Mass Index (BMI): 19.9 Reference Range: 80 - 120 mg / dl Pulse Oximetry (%): 100 Notes 3L of O2; MD notified of BP 88/44. Electronic Signature(s) Signed: 02/15/2017 5:19:45 PM By: [name], RN, BSN, [name] RN, BSN Entered By: [name], RN, BSN, [name] on 02/15/2017 83:35:82

## 2017-02-16 NOTE — Progress Notes (Signed)
[name] [name] ([id number]) Visit Report for 02/15/2017 Chief Complaint Document Details Patient Name: [name] [name], [name] [name] Date of Service: 02/15/2017 12:30 PM Medical Record Number: [id number] Patient Account Number: [id number] Date of Birth/Sex: [birth year] (83 y.o. Male) Treating RN: [name] Primary Care Provider: [name] Other Clinician: Referring Provider: [name] Treating Provider/Extender: Tito Dine in Treatment: 5 Information Obtained from: Patient Chief Complaint The patient is here for initial evaluation of his left trocanter pressure ulcer Electronic Signature(s) Signed: 02/15/2017 4:47:06 PM By: Linton Ham MD Entered By: Linton Ham on 02/15/2017 14:23:07 [name] [name] ([id number]) -------------------------------------------------------------------------------- HPI Details Patient Name: [name] [name] Date of Service: 02/15/2017 12:30 PM Medical Record Number: [id number] Patient Account Number: [id number] Date of Birth/Sex: 11/13/1928 (83 y.o. Male) Treating RN: [name] Primary Care Provider: [name] Other Clinician: Referring Provider: [name] Treating Provider/Extender: Tito Dine in Treatment: 5 History of Present Illness Location: left trocanter Quality: denies pain Duration: the ulcer was noticed two [name] ago Context: ulcer is secondary to pressure Modifying Factors: unrelieved pressure is an exacerbating factor HPI Description: 01/05/17- the patient is here for initial violation of a left trochanter pressure ulcer that was originally noticed by his wife a few [name] ago. He states that he traditionally sleeps on his left side. He has lost between 20 and 30 pounds of the past 6 months. He cannot articulate a specific reason for this he states that it's not a loss of appetite. He denies getting fatigued with eating, despite having pulmonary fibrosis, COPD and being exertionally oxygen dependent.  They have been applying neosporin. Lab work obtained in January revealed an albumin level 3.7. He is a non-diabetic, former smoker (quitting > 40 yrs ago) 01/12/17; left trochanter ulcer. smaller using iodosorb which was started last week. He is hypoxic. does not keep O2 on reliable at least here. When this happen P02 drops in the 70s 01/19/17; left trochanteric ulcer using Iodosorb about half the size of last week. O2 sat is 96% on 2 L respiratory status seems more stable 01/26/17 wouind measures slightly larger this week. non viable surface. Using iodosorb 02/02/17; again measurements unchanged this week. Non-viable surface. We had been using Iodosorb with some improvement I switched off the Prisma last week but I can't really understand why that was done. 02/09/17; dimensions are smaller left greater trochanter. Still in nonviable surface. We've been using Iodosorb. Fortunately the stage I pressure area over the right hip has resolved. 02/15/17; patient arrived in the clinic with low blood pressure. He is on metoprolol and lisinopril but the doses are unclear. Also takes torsemide 20 twice a day. He is on these largely for cardiac issues not particularly hypertension and in discussion with his wife this marginal blood pressure issue has been something that is been noted in the past. Electronic Signature(s) Signed: 02/15/2017 4:47:06 PM By: Linton Ham MD Entered By: Linton Ham on 02/15/2017 14:24:29 [name] [name] ([id number]) -------------------------------------------------------------------------------- Physical Exam Details Patient Name: [name] [name] Date of Service: 02/15/2017 12:30 PM Medical Record Number: [id number] Patient Account Number: [id number] Date of Birth/Sex: Oct 25, 1928 (83 y.o. Male) Treating RN: [name] Primary Care Provider: [name] Other Clinician: Referring Provider: [name] Treating Provider/Extender: [name] [name] in Treatment:  5 Constitutional Patient is hypotensive.. Pulse regular and within target range for patient.Marland Kitchen Respirations regular, non-labored and within target range.. Temperature is normal and within the target range for the patient.. Patient's appearance is neat and clean. Appears in no  acute distress. Well nourished and well developed.. Eyes Conjunctivae clear. No discharge.Marland Kitchen Respiratory Respiratory effort is easy and symmetric bilaterally. Rate is normal at rest and on room air.. Bilateral breath sounds are clear and equal in all lobes with no wheezes, rales or rhonchi.. Cardiovascular Heart rhythm and rate regular, without murmur or gallop. He does not appear volume contracted. Gastrointestinal (GI) Abdomen is soft and non-distended without masses or tenderness. Bowel sounds active in all quadrants.. No liver or spleen enlargement or tenderness.. Integumentary (Hair, Skin) No rashes noted.Marland Kitchen Psychiatric No evidence of depression, anxiety, or agitation. Calm, cooperative, and communicative. Appropriate interactions and affect.. Notes Wound exam; the area on the left greater trochanter is smaller base looks healthier. I switch back to Iodoflex last week and this really seems to have helped. This stage I area on the right hip is also resolved Electronic Signature(s) Signed: 02/15/2017 4:47:06 PM By: Linton Ham MD Entered By: Linton Ham on 02/15/2017 14:25:59 [name] [name] ([id number]) -------------------------------------------------------------------------------- Physician Orders Details Patient Name: [name] [name] Date of Service: 02/15/2017 12:30 PM Medical Record Number: [id number] Patient Account Number: [id number] Date of Birth/Sex: 1929/01/14 (83 y.o. Male) Treating RN: [name] Primary Care Provider: [name] Other Clinician: Referring Provider: [name] Treating Provider/Extender: Tito Dine in Treatment: 5 Verbal / Phone Orders:  No Diagnosis Coding Wound Cleansing Wound #1 Left Trochanter o May Shower, gently pat wound dry prior to applying new dressing. Anesthetic Wound #1 Left Trochanter o Topical Lidocaine 4% cream applied to wound bed prior to debridement - in clinic only Skin Barriers/Peri-Wound Care Wound #1 Left Trochanter o Skin Prep Primary Wound Dressing Wound #1 Left Trochanter o Iodosorb Ointment Secondary Dressing Wound #1 Left Trochanter o Dry Gauze o Boardered Foam Dressing Dressing Change Frequency Wound #1 Left Trochanter o Change dressing every other day. Follow-up Appointments Wound #1 Left Trochanter o Return Appointment in 1 week. Off-Loading Wound #1 Left Trochanter o Turn and reposition every 2 hours - Try to stay off of left hip. Additional Orders / Instructions [name] [name], [name] [name] ([id number]) Wound #1 Left Trochanter o Other: - Do not leave home without Oxygen. Electronic Signature(s) Signed: 02/15/2017 4:47:06 PM By: Linton Ham MD Signed: 02/15/2017 5:19:45 PM By: [name] RN, BSN, [name] RN, BSN Entered By: [name], RN, BSN, [name] on 02/15/2017 13:08:46 [name] [name] ([id number]) -------------------------------------------------------------------------------- Problem List Details Patient Name: [name] [name], [name] [name] Date of Service: 02/15/2017 12:30 PM Medical Record Number: [id number] Patient Account Number: [id number] Date of Birth/Sex: 1929-07-21 (83 y.o. Male) Treating RN: [name] Primary Care Provider: [name] Other Clinician: Referring Provider: [name] Treating Provider/Extender: [name] [name] in Treatment: 5 Active Problems ICD-10 Encounter Code Description Active Date Diagnosis L89.220 Pressure ulcer of left hip, unstageable 01/05/2017 Yes R63.4 Abnormal weight loss 01/05/2017 Yes J84.10 Pulmonary fibrosis, unspecified 01/05/2017 Yes Inactive Problems Resolved Problems Electronic Signature(s) Signed: 02/15/2017 4:47:06  PM By: Linton Ham MD Entered By: Linton Ham on 02/15/2017 14:22:56 [name] [name] ([id number]) -------------------------------------------------------------------------------- Progress Note Details Patient Name: [name] [name] Date of Service: 02/15/2017 12:30 PM Medical Record Number: [id number] Patient Account Number: [id number] Date of Birth/Sex: 1929-09-21 (83 y.o. Male) Treating RN: [name] Primary Care Provider: [name] Other Clinician: Referring Provider: [name] Treating Provider/Extender: Tito Dine in Treatment: 5 Subjective Chief Complaint Information obtained from Patient The patient is here for initial evaluation of his left trocanter pressure ulcer History of Present Illness (HPI) The following HPI elements were documented for the patient's  wound: Location: left trocanter Quality: denies pain Duration: the ulcer was noticed two [name] ago Context: ulcer is secondary to pressure Modifying Factors: unrelieved pressure is an exacerbating factor 01/05/17- the patient is here for initial violation of a left trochanter pressure ulcer that was originally noticed by his wife a few [name] ago. He states that he traditionally sleeps on his left side. He has lost between 20 and 30 pounds of the past 6 months. He cannot articulate a specific reason for this he states that it's not a loss of appetite. He denies getting fatigued with eating, despite having pulmonary fibrosis, COPD and being exertionally oxygen dependent. They have been applying neosporin. Lab work obtained in January revealed an albumin level 3.7. He is a non-diabetic, former smoker (quitting > 40 yrs ago) 01/12/17; left trochanter ulcer. smaller using iodosorb which was started last week. He is hypoxic. does not keep O2 on reliable at least here. When this happen P02 drops in the 70s 01/19/17; left trochanteric ulcer using Iodosorb about half the size of last week. O2 sat is 96%  on 2 L respiratory status seems more stable 01/26/17 wouind measures slightly larger this week. non viable surface. Using iodosorb 02/02/17; again measurements unchanged this week. Non-viable surface. We had been using Iodosorb with some improvement I switched off the Prisma last week but I can't really understand why that was done. 02/09/17; dimensions are smaller left greater trochanter. Still in nonviable surface. We've been using Iodosorb. Fortunately the stage I pressure area over the right hip has resolved. 02/15/17; patient arrived in the clinic with low blood pressure. He is on metoprolol and lisinopril but the doses are unclear. Also takes torsemide 20 twice a day. He is on these largely for cardiac issues not particularly hypertension and in discussion with his wife this marginal blood pressure issue has been something that is been noted in the past. [name] [name], [name] [name] ([id number]) Objective Constitutional Patient is hypotensive.. Pulse regular and within target range for patient.Marland Kitchen Respirations regular, non-labored and within target range.. Temperature is normal and within the target range for the patient.. Patient's appearance is neat and clean. Appears in no acute distress. Well nourished and well developed.. Vitals Time Taken: 12:50 PM, Height: 71 in, Weight: 143 lbs, BMI: 19.9, Pulse: 80 bpm, Respiratory Rate: 18 breaths/min, Blood Pressure: 88/44 mmHg, Pulse Oximetry: 100 %. General Notes: 3L of O2; MD notified of BP 88/44. Eyes Conjunctivae clear. No discharge.Marland Kitchen Respiratory Respiratory effort is easy and symmetric bilaterally. Rate is normal at rest and on room air.. Bilateral breath sounds are clear and equal in all lobes with no wheezes, rales or rhonchi.. Cardiovascular Heart rhythm and rate regular, without murmur or gallop. He does not appear volume contracted. Gastrointestinal (GI) Abdomen is soft and non-distended without masses or tenderness. Bowel sounds active in all  quadrants.. No liver or spleen enlargement or tenderness.Marland Kitchen Psychiatric No evidence of depression, anxiety, or agitation. Calm, cooperative, and communicative. Appropriate interactions and affect.. General Notes: Wound exam; the area on the left greater trochanter is smaller base looks healthier. I switch back to Iodoflex last week and this really seems to have helped. This stage I area on the right hip is also resolved Integumentary (Hair, Skin) No rashes noted.. Wound #1 status is Open. Original cause of wound was Pressure Injury. The wound is located on the Left Trochanter. The wound measures 0.4cm length x 0.4cm width x 0.1cm depth; 0.126cm^2 area and 0.013cm^3 volume. The wound is limited to skin breakdown. There  is no tunneling or undermining noted. There is a large amount of serous drainage noted. The wound margin is flat and intact. There is no granulation within the wound bed. There is a large (67-100%) amount of necrotic tissue within the wound bed including Adherent Slough. The periwound skin appearance did not exhibit: Callus, Crepitus, Excoriation, Induration, Rash, Scarring, Dry/Scaly, Maceration, Atrophie Blanche, Cyanosis, Ecchymosis, Hemosiderin Staining, Mottled, Pallor, Rubor, Erythema. [name] [name], [name] [name] ([id number]) Assessment Active Problems ICD-10 [phone] - Pressure ulcer of left hip, unstageable R63.4 - Abnormal weight loss J84.10 - Pulmonary fibrosis, unspecified Plan Wound Cleansing: Wound #1 Left Trochanter: May Shower, gently pat wound dry prior to applying new dressing. Anesthetic: Wound #1 Left Trochanter: Topical Lidocaine 4% cream applied to wound bed prior to debridement - in clinic only Skin Barriers/Peri-Wound Care: Wound #1 Left Trochanter: Skin Prep Primary Wound Dressing: Wound #1 Left Trochanter: Iodosorb Ointment Secondary Dressing: Wound #1 Left Trochanter: Dry Gauze Boardered Foam Dressing Dressing Change Frequency: Wound #1 Left  Trochanter: Change dressing every other day. Follow-up Appointments: Wound #1 Left Trochanter: Return Appointment in 1 week. Off-Loading: Wound #1 Left Trochanter: Turn and reposition every 2 hours - Try to stay off of left hip. Additional Orders / Instructions: Wound #1 Left Trochanter: Other: - Do not leave home without Oxygen. [name] [name], [name] [name] ([id number]) #1 continue with Iodoflex to the left greater trochanter wound. I think this may be closed in a week or 2. #2 I suspect the patient's hypotension is chronically a problem by discussion with his wife. I will look and see if I can find an echocardiogram however he follows with cardiology at [facility]. We may be able to back off a bit on his diuretic he is not bradycardic and there are no other signs of a systemic or cardiac issue Electronic Signature(s) Signed: 02/15/2017 4:47:06 PM By: Linton Ham MD Entered By: Linton Ham on 02/15/2017 14:26:56 [name] [name] ([id number]) -------------------------------------------------------------------------------- [place] Details Patient Name: [name] [name] Date of Service: 02/15/2017 Medical Record Number: [id number] Patient Account Number: [id number] Date of Birth/Sex: [birth year] (83 y.o. Male) Treating RN: [name] Primary Care Provider: [name] Other Clinician: Referring Provider: [name] Treating Provider/Extender: Tito Dine in Treatment: 5 Diagnosis Coding ICD-10 Codes Code Description [phone] Pressure ulcer of left hip, unstageable R63.4 Abnormal weight loss J84.10 Pulmonary fibrosis, unspecified I95.89 Other hypotension Facility Procedures CPT4 Code: [id number] Description: [id number] - WOUND CARE VISIT-LEV 2 EST PT Modifier: Quantity: 1 Physician Procedures CPT4 Code: [id number] Description: 23361 - WC PHYS LEVEL 3 - EST PT ICD-10 Description Diagnosis L89.220 Pressure ulcer of left hip, unstageable Modifier: Quantity: 1 Electronic  Signature(s) Signed: 02/15/2017 4:47:06 PM By: Linton Ham MD Entered By: Linton Ham on 02/15/2017 14:27:30

## 2017-02-22 ENCOUNTER — Encounter: Payer: Medicare Other | Admitting: Internal Medicine

## 2017-02-22 DIAGNOSIS — L89220 Pressure ulcer of left hip, unstageable: Secondary | ICD-10-CM | POA: Diagnosis not present

## 2017-02-23 NOTE — Progress Notes (Signed)
RUBLE, PUMPHREY ([id number]) Visit Report for 02/22/2017 HPI Details Patient Name: [name] [name], [name] [name] Date of Service: 02/22/2017 12:30 PM Medical Record Number: [id number] Patient Account Number: [id number] Date of Birth/Sex: 05/04/1929 (83 y.o. Male) Treating RN: Montey Hora Primary Care Provider: [name] Other Clinician: Referring Provider: [name] Treating Provider/Extender: [name] [name] in Treatment: 6 History of Present Illness Location: left trocanter Quality: denies pain Duration: the ulcer was noticed two [name] ago Context: ulcer is secondary to pressure Modifying Factors: unrelieved pressure is an exacerbating factor HPI Description: 01/05/17- the patient is here for initial violation of a left trochanter pressure ulcer that was originally noticed by his wife a few [name] ago. He states that he traditionally sleeps on his left side. He has lost between 20 and 30 pounds of the past 6 months. He cannot articulate a specific reason for this he states that it's not a loss of appetite. He denies getting fatigued with eating, despite having pulmonary fibrosis, COPD and being exertionally oxygen dependent. They have been applying neosporin. Lab work obtained in January revealed an albumin level 3.7. He is a non-diabetic, former smoker (quitting > 40 yrs ago) 01/12/17; left trochanter ulcer. smaller using iodosorb which was started last week. He is hypoxic. does not keep O2 on reliable at least here. When this happen P02 drops in the 70s 01/19/17; left trochanteric ulcer using Iodosorb about half the size of last week. O2 sat is 96% on 2 L respiratory status seems more stable 01/26/17 wouind measures slightly larger this week. non viable surface. Using iodosorb 02/02/17; again measurements unchanged this week. Non-viable surface. We had been using Iodosorb with some improvement I switched off the Prisma last week but I can't really understand why that was  done. 02/09/17; dimensions are smaller left greater trochanter. Still in nonviable surface. We've been using Iodosorb. Fortunately the stage I pressure area over the right hip has resolved. 02/15/17; patient arrived in the clinic with low blood pressure. He is on metoprolol and lisinopril but the doses are unclear. Also takes torsemide 20 twice a day. He is on these largely for cardiac issues not particularly hypertension and in discussion with his wife this marginal blood pressure issue has been something that is been noted in the past. 02/22/17; 0.5x0.7x0.1 slightly larger than last week. Electronic Signature(s) Signed: 02/22/2017 3:44:58 PM By: Linton Ham MD Entered By: Linton Ham on 02/22/2017 13:04:53 [name] [name] ([id number]) -------------------------------------------------------------------------------- Physical Exam Details Patient Name: [name] [name] Date of Service: 02/22/2017 12:30 PM Medical Record Number: [id number] Patient Account Number: [id number] Date of Birth/Sex: Apr 08, 1929 (83 y.o. Male) Treating RN: Montey Hora Primary Care Provider: [name] Other Clinician: Referring Provider: [name] Treating Provider/Extender: [name] [name] in Treatment: 6 Constitutional Sitting or standing Blood Pressure is within target range for patient.. Pulse regular and within target range for patient.Marland Kitchen Respirations regular, non-labored and within target range.. Temperature is normal and within the target range for the patient.. Patient's appearance is neat and clean. Appears in no acute distress. Well nourished and well developed.. Eyes Conjunctivae clear. No discharge.Marland Kitchen Respiratory oxygen on. bibasilar crackes. Cardiovascular Heart rhythm and rate regular, without murmur or gallop.Marland Kitchen Lymphatic . Psychiatric No evidence of depression, anxiety, or agitation. Calm, cooperative, and communicative. Appropriate interactions and affect.. Notes wound  exam; wound has a health base but larger measurements. No surrounding infection. opressure . Electronic Signature(s) Signed: 02/22/2017 3:44:58 PM By: Linton Ham MD Entered By: Linton Ham on 02/22/2017 13:08:04 Anette Guarneri  Darnell Level ([id number]) -------------------------------------------------------------------------------- Physician Orders Details Patient Name: [name] [name], [name] [name]. Date of Service: 02/22/2017 12:30 PM Medical Record Number: [id number] Patient Account Number: [id number] Date of Birth/Sex: Sep 01, 1929 (83 y.o. Male) Treating RN: Montey Hora Primary Care Provider: [name] Other Clinician: Referring Provider: [name] Treating Provider/Extender: [name] [name] in Treatment: 6 Verbal / Phone Orders: No Diagnosis Coding Wound Cleansing Wound #1 Left Trochanter o May Shower, gently pat wound dry prior to applying new dressing. Anesthetic Wound #1 Left Trochanter o Topical Lidocaine 4% cream applied to wound bed prior to debridement - in clinic only Skin Barriers/Peri-Wound Care Wound #1 Left Trochanter o Skin Prep Primary Wound Dressing Wound #1 Left Trochanter o Hydrafera Blue Secondary Dressing Wound #1 Left Trochanter o Dry Gauze o Boardered Foam Dressing Dressing Change Frequency Wound #1 Left Trochanter o Change dressing every other day. Follow-up Appointments Wound #1 Left Trochanter o Return Appointment in 1 week. Off-Loading Wound #1 Left Trochanter o Turn and reposition every 2 hours - Try to stay off of left hip. Additional Orders / Instructions [name] [name], [name] [name] ([id number]) Wound #1 Left Trochanter o Other: - Do not leave home without Oxygen. Electronic Signature(s) Signed: 02/22/2017 3:34:25 PM By: Montey Hora Signed: 02/22/2017 3:44:58 PM By: Linton Ham MD Entered By: Montey Hora on 02/22/2017 12:56:06 [name] [name]  ([id number]) -------------------------------------------------------------------------------- Problem List Details Patient Name: [name] [name] Date of Service: 02/22/2017 12:30 PM Medical Record Number: [id number] Patient Account Number: [id number] Date of Birth/Sex: 10-29-1928 (83 y.o. Male) Treating RN: Montey Hora Primary Care Provider: [name] Other Clinician: Referring Provider: [name] Treating Provider/Extender: [name] [name] in Treatment: 6 Active Problems ICD-10 Encounter Code Description Active Date Diagnosis L89.220 Pressure ulcer of left hip, unstageable 01/05/2017 Yes R63.4 Abnormal weight loss 01/05/2017 Yes J84.10 Pulmonary fibrosis, unspecified 01/05/2017 Yes Inactive Problems Resolved Problems Electronic Signature(s) Signed: 02/22/2017 3:44:58 PM By: Linton Ham MD Entered By: Linton Ham on 02/22/2017 13:03:17 [name] [name] ([id number]) -------------------------------------------------------------------------------- Progress Note Details Patient Name: [name] [name] Date of Service: 02/22/2017 12:30 PM Medical Record Number: [id number] Patient Account Number: [id number] Date of Birth/Sex: 1929-02-10 (83 y.o. Male) Treating RN: Montey Hora Primary Care Provider: [name] Other Clinician: Referring Provider: [name] Treating Provider/Extender: [name] [name] in Treatment: 6 Subjective History of Present Illness (HPI) The following HPI elements were documented for the patient's wound: Location: left trocanter Quality: denies pain Duration: the ulcer was noticed two [name] ago Context: ulcer is secondary to pressure Modifying Factors: unrelieved pressure is an exacerbating factor 01/05/17- the patient is here for initial violation of a left trochanter pressure ulcer that was originally noticed by his wife a few [name] ago. He states that he traditionally sleeps on his left side. He has lost between 20  and 30 pounds of the past 6 months. He cannot articulate a specific reason for this he states that it's not a loss of appetite. He denies getting fatigued with eating, despite having pulmonary fibrosis, COPD and being exertionally oxygen dependent. They have been applying neosporin. Lab work obtained in January revealed an albumin level 3.7. He is a non-diabetic, former smoker (quitting > 40 yrs ago) 01/12/17; left trochanter ulcer. smaller using iodosorb which was started last week. He is hypoxic. does not keep O2 on reliable at least here. When this happen P02 drops in the 70s 01/19/17; left trochanteric ulcer using Iodosorb about half the size of last week. O2 sat is 96% on 2 L respiratory status seems  more stable 01/26/17 wouind measures slightly larger this week. non viable surface. Using iodosorb 02/02/17; again measurements unchanged this week. Non-viable surface. We had been using Iodosorb with some improvement I switched off the Prisma last week but I can't really understand why that was done. 02/09/17; dimensions are smaller left greater trochanter. Still in nonviable surface. We've been using Iodosorb. Fortunately the stage I pressure area over the right hip has resolved. 02/15/17; patient arrived in the clinic with low blood pressure. He is on metoprolol and lisinopril but the doses are unclear. Also takes torsemide 20 twice a day. He is on these largely for cardiac issues not particularly hypertension and in discussion with his wife this marginal blood pressure issue has been something that is been noted in the past. 02/22/17; 0.5x0.7x0.1 slightly larger than last week. Objective Constitutional Sitting or standing Blood Pressure is within target range for patient.. Pulse regular and within target range [name] [name], [name] [name] ([id number]) for patient.Marland Kitchen Respirations regular, non-labored and within target range.. Temperature is normal and within the target range for the patient.. Patient's  appearance is neat and clean. Appears in no acute distress. Well nourished and well developed.. Vitals Time Taken: 12:36 PM, Height: 71 in, Weight: 143 lbs, BMI: 19.9, Temperature: 97.4 F, Pulse: 87 bpm, Respiratory Rate: 20 breaths/min, Blood Pressure: 121/58 mmHg. Eyes Conjunctivae clear. No discharge.Marland Kitchen Respiratory oxygen on. bibasilar crackes. Cardiovascular Heart rhythm and rate regular, without murmur or gallop.Marland Kitchen Psychiatric No evidence of depression, anxiety, or agitation. Calm, cooperative, and communicative. Appropriate interactions and affect.. General Notes: wound exam; wound has a health base but larger measurements. No surrounding infection. opressure . Integumentary (Hair, Skin) Wound #1 status is Open. Original cause of wound was Pressure Injury. The wound is located on the Left Trochanter. The wound measures 0.5cm length x 0.7cm width x 0.1cm depth; 0.275cm^2 area and 0.027cm^3 volume. The wound is limited to skin breakdown. There is no tunneling or undermining noted. There is a large amount of serous drainage noted. The wound margin is flat and intact. There is large (67- 100%) pink granulation within the wound bed. There is a small (1-33%) amount of necrotic tissue within the wound bed including Adherent Slough. The periwound skin appearance did not exhibit: Callus, Crepitus, Excoriation, Induration, Rash, Scarring, Dry/Scaly, Maceration, Atrophie Blanche, Cyanosis, Ecchymosis, Hemosiderin Staining, Mottled, Pallor, Rubor, Erythema. Assessment Active Problems ICD-10 L89.220 - Pressure ulcer of left hip, unstageable R63.4 - Abnormal weight loss J84.10 - Pulmonary fibrosis, unspecified [name] [name], [name] [name] ([id number]) Plan Wound Cleansing: Wound #1 Left Trochanter: May Shower, gently pat wound dry prior to applying new dressing. Anesthetic: Wound #1 Left Trochanter: Topical Lidocaine 4% cream applied to wound bed prior to debridement - in clinic only Skin  Barriers/Peri-Wound Care: Wound #1 Left Trochanter: Skin Prep Primary Wound Dressing: Wound #1 Left Trochanter: Hydrafera Blue Secondary Dressing: Wound #1 Left Trochanter: Dry Gauze Boardered Foam Dressing Dressing Change Frequency: Wound #1 Left Trochanter: Change dressing every other day. Follow-up Appointments: Wound #1 Left Trochanter: Return Appointment in 1 week. Off-Loading: Wound #1 Left Trochanter: Turn and reposition every 2 hours - Try to stay off of left hip. Additional Orders / Instructions: Wound #1 Left Trochanter: Other: - Do not leave home without Oxygen. 1 change to hydrofera blue. tried collagen last time however surface deteriorated 2 BP is better today Electronic Signature(s) Signed: 02/22/2017 3:44:58 PM By: Linton Ham MD Entered By: Linton Ham on 02/22/2017 13:09:06 [name] [name], [name] [name] ([id number]) [name] [name], [name] [name] ([id number]) -------------------------------------------------------------------------------- [place] Details Patient Name:  [name] [name]. Date of Service: 02/22/2017 Medical Record Number: [id number] Patient Account Number: [id number] Date of Birth/Sex: [birth year] (83 y.o. Male) Treating RN: Montey Hora Primary Care Provider: [name] Other Clinician: Referring Provider: [name] Treating Provider/Extender: [name] [name] in Treatment: 6 Diagnosis Coding ICD-10 Codes Code Description [phone] Pressure ulcer of left hip, unstageable R63.4 Abnormal weight loss J84.10 Pulmonary fibrosis, unspecified Facility Procedures CPT4 Code: [id number] Description: [phone] - WOUND CARE VISIT-LEV 2 EST PT Modifier: Quantity: 1 Physician Procedures CPT4 Code: [id number] Description: 15830 - WC PHYS LEVEL 3 - EST PT ICD-10 Description Diagnosis L89.220 Pressure ulcer of left hip, unstageable Modifier: Quantity: 1 Electronic Signature(s) Signed: 02/22/2017 1:10:59 PM By: Montey Hora Signed: 02/22/2017 3:44:58 PM  By: Linton Ham MD Entered By: Montey Hora on 02/22/2017 13:10:59

## 2017-02-23 NOTE — Progress Notes (Signed)
RISHITH, SIDDOWAY ([id number]) Visit Report for 02/22/2017 Arrival Information Details Patient Name: [name] [name], [name] [name] Date of Service: 02/22/2017 12:30 PM Medical Record Number: [id number] Patient Account Number: [id number] Date of Birth/Sex: 1928/12/26 (83 y.o. Male) Treating RN: Montey Hora Primary Care Mako Pelfrey: [name] Other Clinician: Referring Branston Halsted: [name] Treating Prarthana Parlin/Extender: Tito Dine in Treatment: 6 Visit Information History Since Last Visit Added or deleted any medications: No Patient Arrived: Walker Any new allergies or adverse reactions: No Arrival Time: 12:35 Had a fall or experienced change in No Accompanied By: spouse activities of daily living that may affect Transfer Assistance: None risk of falls: Patient Identification Verified: Yes Signs or symptoms of abuse/neglect since last No Secondary Verification Process Yes visito Completed: Hospitalized since last visit: No Patient Requires Transmission- No Has Dressing in Place as Prescribed: Yes Based Precautions: Pain Present Now: No Patient Has Alerts: Yes Patient Alerts: Patient on Blood Thinner Warfarin NOT Diabetic Electronic Signature(s) Signed: 02/22/2017 3:34:25 PM By: Montey Hora Entered By: Montey Hora on 02/22/2017 12:36:04 [name] [name] ([id number]) -------------------------------------------------------------------------------- Clinic Level of Care Assessment Details Patient Name: [name] [name] Date of Service: 02/22/2017 12:30 PM Medical Record Number: [id number] Patient Account Number: [id number] Date of Birth/Sex: 11/27/1928 (83 y.o. Male) Treating RN: Montey Hora Primary Care Tom Macpherson: [name] Other Clinician: Referring Owin Vignola: [name] Treating Ruthy Forry/Extender: Tito Dine in Treatment: 6 Clinic Level of Care Assessment Items TOOL 4 Quantity Score _0  - Use when only an EandM is performed on FOLLOW-UP  visit 0 ASSESSMENTS - Nursing Assessment / Reassessment X - Reassessment of Co-morbidities (includes updates in patient status) 1 10 X - Reassessment of Adherence to Treatment Plan 1 5 ASSESSMENTS - Wound and Skin Assessment / Reassessment X - Simple Wound Assessment / Reassessment - one wound 1 5 _1  - Complex Wound Assessment / Reassessment - multiple wounds 0 _2  - Dermatologic / Skin Assessment (not related to wound area) 0 ASSESSMENTS - Focused Assessment _3  - Circumferential Edema Measurements - multi extremities 0 _4  - Nutritional Assessment / Counseling / Intervention 0 _5  - Lower Extremity Assessment (monofilament, tuning fork, pulses) 0 _6  - Peripheral Arterial Disease Assessment (using hand held doppler) 0 ASSESSMENTS - Ostomy and/or Continence Assessment and Care _7  - Incontinence Assessment and Management 0 _8  - Ostomy Care Assessment and Management (repouching, etc.) 0 PROCESS - Coordination of Care X - Simple Patient / Family Education for ongoing care 1 15 _9  - Complex (extensive) Patient / Family Education for ongoing care 0 _10  - Staff obtains Programmer, systems, Records, Test Results / Process Orders 0 _11  - Staff telephones HHA, Nursing Homes / Clarify orders / etc 0 _12  - Routine Transfer to another Facility (non-emergent condition) 0 [name] [name], [name] [name] ([id number]) _13  - Routine Hospital Admission (non-emergent condition) 0 _14  - New Admissions / Biomedical engineer / Ordering NPWT, Apligraf, etc. 0 _15  - Emergency Hospital Admission (emergent condition) 0 X - Simple Discharge Coordination 1 10 _16  - Complex (extensive) Discharge Coordination 0 PROCESS - Special Needs _17  - Pediatric / Minor Patient Management 0 _18  - Isolation Patient Management 0 _19  - Hearing / Language / Visual special needs 0 _20  - Assessment of Community assistance (transportation, D/C planning, etc.) 0 _21  - Additional assistance / Altered mentation 0 _22  - Support Surface(s) Assessment (bed, cushion, seat,  etc.) 0 INTERVENTIONS - Wound Cleansing / Measurement X - Simple Wound Cleansing - one wound 1 5 _23  - Complex Wound Cleansing - multiple wounds 0 X - Wound  Imaging (photographs - any number of wounds) 1 5 _0  - Wound Tracing (instead of photographs) 0 X - Simple Wound Measurement - one wound 1 5 _1  - Complex Wound Measurement - multiple wounds 0 INTERVENTIONS - Wound Dressings X - Small Wound Dressing one or multiple wounds 1 10 _2  - Medium Wound Dressing one or multiple wounds 0 _3  - Large Wound Dressing one or multiple wounds 0 <XNATFTDDUKGURKYH>_0<\/WCBJSEGBTDVVOHYW>_7  - Application of Medications - topical 0 <PXTGGYIRSWNIOEVO>_3<\/JKKXFGHWEXHBZJIR>_6  - Application of Medications - injection 0 INTERVENTIONS - Miscellaneous _6  - External ear exam 0 [name] [name], [name] [name] ([id number]) _7  - Specimen Collection (cultures, biopsies, blood, body fluids, etc.) 0 _8  - Specimen(s) / Culture(s) sent or taken to Lab for analysis 0 _9  - Patient Transfer (multiple staff / Harrel Lemon Lift / Similar devices) 0 _10  - Simple Staple / Suture removal (25 or less) 0 _11  - Complex Staple / Suture removal (26 or more) 0 _12  - Hypo / Hyperglycemic Management (close monitor of Blood Glucose) 0 _13  - Ankle / Brachial Index (ABI) - do not check if billed separately 0 X - Vital Signs 1 5 Has the patient been seen at the hospital within the last three years: Yes Total Score: 75 Level Of Care: New/Established - Level 2 Electronic Signature(s) Signed: 02/22/2017 3:34:25 PM By: Montey Hora Entered By: Montey Hora on 02/22/2017 13:10:51 [name] [name] ([id number]) -------------------------------------------------------------------------------- Encounter Discharge Information Details Patient Name: [name] [name] Date of Service: 02/22/2017 12:30 PM Medical Record Number: [id number] Patient Account Number: [id number] Date of Birth/Sex: 1929/09/04 (83 y.o. Male) Treating RN: Montey Hora Primary Care Brelyn Woehl: [name] Other Clinician: Referring Jaevin Medearis: [name] Treating  Aleene Swanner/Extender: Tito Dine in Treatment: 6 Encounter Discharge Information Items Discharge Pain Level: 0 Discharge Condition: Stable Ambulatory Status: Walker Discharge Destination: Home Transportation: Private Auto Accompanied By: spouse Schedule Follow-up Appointment: Yes Medication Reconciliation completed No and provided to Patient/Care Chike Farrington: Provided on Clinical Summary of Care: 02/22/2017 Form Type Recipient Paper Patient CG Electronic Signature(s) Signed: 02/22/2017 1:11:38 PM By: Montey Hora Previous Signature: 02/22/2017 1:04:17 PM Version By: Ruthine Dose Entered By: Montey Hora on 02/22/2017 13:11:38 [name] [name] ([id number]) -------------------------------------------------------------------------------- Multi Wound Chart Details Patient Name: [name] [name] Date of Service: 02/22/2017 12:30 PM Medical Record Number: [id number] Patient Account Number: [id number] Date of Birth/Sex: Jul 26, 1929 (83 y.o. Male) Treating RN: Montey Hora Primary Care Mardee Clune: [name] Other Clinician: Referring Margurette Brener: [name] Treating Keiri Solano/Extender: Tito Dine in Treatment: 6 Vital Signs Height(in): 71 Pulse(bpm): 87 Weight(lbs): 143 Blood Pressure 121/58 (mmHg): Body Mass Index(BMI): 20 Temperature(F): 97.4 Respiratory Rate 20 (breaths/min): Photos: [1:No Photos] [N/A:N/A] Wound Location: [1:Left Trochanter] [N/A:N/A] Wounding Event: [1:Pressure Injury] [N/A:N/A] Primary Etiology: [1:Pressure Ulcer] [N/A:N/A] Comorbid History: [1:Cataracts, Chronic Obstructive Pulmonary Disease (COPD), Arrhythmia, Congestive Heart Failure, Coronary Artery Disease, Hypotension, Myocardial Infarction, Gout, Osteoarthritis] [N/A:N/A] Date Acquired: [1:12/22/2016] [N/A:N/A] Weeks of Treatment: [1:6] [N/A:N/A] Wound Status: [1:Open] [N/A:N/A] Measurements L x W x D 0.5x0.7x0.1 [N/A:N/A] (cm) Area (cm) : [1:0.275]  [N/A:N/A] Volume (cm) : [1:0.027] [N/A:N/A] % Reduction in Area: [1:16.70%] [N/A:N/A] % Reduction in Volume: 18.20% [N/A:N/A] Classification: [1:Unstageable/Unclassified] [N/A:N/A] Exudate Amount: [1:Large] [N/A:N/A] Exudate Type: [1:Serous] [N/A:N/A] Exudate Color: [1:amber] [N/A:N/A] Wound Margin: [1:Flat and Intact] [N/A:N/A] Granulation Amount: [1:Large (67-100%)] [N/A:N/A] Granulation Quality: [1:Pink] [N/A:N/A] Necrotic Amount: [1:Small (1-33%)] [N/A:N/A] Exposed Structures: Fascia: No N/A N/A Fat Layer (Subcutaneous Tissue) Exposed: No Tendon: No Muscle: No Joint: No Bone: No Limited to Skin Breakdown Epithelialization: None N/A N/A Periwound Skin Texture: Excoriation: No  N/A N/A Induration: No Callus: No Crepitus: No Rash: No Scarring: No Periwound Skin Maceration: No N/A N/A Moisture: Dry/Scaly: No Periwound Skin Color: Atrophie Blanche: No N/A N/A Cyanosis: No Ecchymosis: No Erythema: No Hemosiderin Staining: No Mottled: No Pallor: No Rubor: No Tenderness on No N/A N/A Palpation: Wound Preparation: Ulcer Cleansing: N/A N/A Rinsed/Irrigated with Saline Topical Anesthetic Applied: Other: lidocaine 4% Treatment Notes Electronic Signature(s) Signed: 02/22/2017 3:44:58 PM By: Linton Ham MD Entered By: Linton Ham on 02/22/2017 13:03:31 [name] [name] ([id number]) -------------------------------------------------------------------------------- [place] Details Patient Name: [name] [name] Date of Service: 02/22/2017 12:30 PM Medical Record Number: [id number] Patient Account Number: [id number] Date of Birth/Sex: [birth year] (83 y.o. Male) Treating RN: Montey Hora Primary Care Tauri Ethington: [name] Other Clinician: Referring Kaylyne Axton: [name] Treating Arda Keadle/Extender: Tito Dine in Treatment: 6 Active Inactive ` Abuse / Safety / Falls / Self Care Management Nursing Diagnoses: Impaired  physical mobility Potential for falls Goals: Patient/caregiver will verbalize understanding of skin care regimen Date Initiated: 01/05/2017 Target Resolution Date: 03/29/2017 Goal Status: Active Patient/caregiver will verbalize/demonstrate measures taken to prevent injury and/or falls Date Initiated: 01/05/2017 Target Resolution Date: 03/29/2017 Goal Status: Active Interventions: Assess fall risk on admission and as needed Assess self care needs on admission and as needed Notes: ` Orientation to the Wound Care Program Nursing Diagnoses: Knowledge deficit related to the wound healing center program Goals: Patient/caregiver will verbalize understanding of the [facility] Program Date Initiated: 01/05/2017 Target Resolution Date: 03/29/2017 Goal Status: Active Interventions: Provide education on orientation to the wound center Notes: [name] [name], [name] [name] ([id number]) Pressure Nursing Diagnoses: Knowledge deficit related to management of pressures ulcers Goals: Patient will remain free of pressure ulcers Date Initiated: 01/05/2017 Target Resolution Date: 03/29/2017 Goal Status: Active Patient/caregiver will verbalize understanding of pressure ulcer management Date Initiated: 01/05/2017 Target Resolution Date: 03/29/2017 Goal Status: Active Interventions: Assess offloading mechanisms upon admission and as needed Notes: ` Wound/Skin Impairment Nursing Diagnoses: Knowledge deficit related to ulceration/compromised skin integrity Goals: Ulcer/skin breakdown will heal within 14 weeks Date Initiated: 01/05/2017 Target Resolution Date: 04/06/2017 Goal Status: Active Interventions: Assess patient/caregiver ability to perform ulcer/skin care regimen upon admission and as needed Assess ulceration(s) every visit Treatment Activities: Skin care regimen initiated : 01/05/2017 Topical wound management initiated : 01/05/2017 Notes: Electronic Signature(s) Signed: 02/22/2017 3:34:25 PM By:  Montey Hora Entered By: Montey Hora on 02/22/2017 12:54:33 [name] [name] ([id number]) -------------------------------------------------------------------------------- Pain Assessment Details Patient Name: [name] [name] Date of Service: 02/22/2017 12:30 PM Medical Record Number: [id number] Patient Account Number: [id number] Date of Birth/Sex: Oct 12, 1928 (83 y.o. Male) Treating RN: Montey Hora Primary Care Tailor Lucking: [name] Other Clinician: Referring Betsy Rosello: [name] Treating Cletus Paris/Extender: Tito Dine in Treatment: 6 Active Problems Location of Pain Severity and Description of Pain Patient Has Paino No Site Locations Pain Management and Medication Current Pain Management: Electronic Signature(s) Signed: 02/22/2017 3:34:25 PM By: Montey Hora Entered By: Montey Hora on 02/22/2017 12:36:12 [name] [name] ([id number]) -------------------------------------------------------------------------------- Patient/Caregiver Education Details Patient Name: [name] [name] Date of Service: 02/22/2017 12:30 PM Medical Record Number: [id number] Patient Account Number: [id number] Date of Birth/Gender: Jan 13, 1929 (83 y.o. Male) Treating RN: Montey Hora Primary Care Physician: [name] Other Clinician: Referring Physician: [name] Treating Physician/Extender: Tito Dine in Treatment: 6 Education Assessment Education Provided To: Patient and Caregiver Education Topics Provided Wound/Skin Impairment: Handouts: Other: wound care as ordered Methods: Demonstration, Explain/Verbal Responses: State content correctly Electronic Signature(s) Signed: 02/22/2017 3:34:25 PM  By: Montey Hora Entered By: Montey Hora on 02/22/2017 13:11:55 [name] [name] ([id number]) -------------------------------------------------------------------------------- Wound Assessment Details Patient Name: [name] [name] Date of  Service: 02/22/2017 12:30 PM Medical Record Number: [id number] Patient Account Number: [id number] Date of Birth/Sex: August 14, 1929 (83 y.o. Male) Treating RN: Montey Hora Primary Care Nichol Ator: [name] Other Clinician: Referring Brenna Friesenhahn: [name] Treating Meade Hogeland/Extender: Tito Dine in Treatment: 6 Wound Status Wound Number: 1 Primary Pressure Ulcer Etiology: Wound Location: Left Trochanter Wound Open Wounding Event: Pressure Injury Status: Date Acquired: 12/22/2016 Comorbid Cataracts, Chronic Obstructive Weeks Of Treatment: 6 History: Pulmonary Disease (COPD), Arrhythmia, Clustered Wound: No Congestive Heart Failure, Coronary Artery Disease, Hypotension, Myocardial Infarction, Gout, Osteoarthritis Photos Photo Uploaded By: Montey Hora on 02/22/2017 14:11:45 Wound Measurements Length: (cm) 0.5 Width: (cm) 0.7 Depth: (cm) 0.1 Area: (cm) 0.275 Volume: (cm) 0.027 % Reduction in Area: 16.7% % Reduction in Volume: 18.2% Epithelialization: None Tunneling: No Undermining: No Wound Description Classification: Unstageable/Unclassified Wound Margin: Flat and Intact Exudate Amount: Large Exudate Type: Serous Exudate Color: amber Foul Odor After Cleansing: No Slough/Fibrino Yes Wound Bed Granulation Amount: Large (67-100%) Exposed Structure Granulation Quality: Pink Fascia Exposed: No [name] [name], [name] [name] ([id number]) Necrotic Amount: Small (1-33%) Fat Layer (Subcutaneous Tissue) Exposed: No Necrotic Quality: Adherent Slough Tendon Exposed: No Muscle Exposed: No Joint Exposed: No Bone Exposed: No Limited to Skin Breakdown Periwound Skin Texture Texture Color No Abnormalities Noted: No No Abnormalities Noted: No Callus: No Atrophie Blanche: No Crepitus: No Cyanosis: No Excoriation: No Ecchymosis: No Induration: No Erythema: No Rash: No Hemosiderin Staining: No Scarring: No Mottled: No Pallor: No Moisture Rubor: No No  Abnormalities Noted: No Dry / Scaly: No Maceration: No Wound Preparation Ulcer Cleansing: Rinsed/Irrigated with Saline Topical Anesthetic Applied: Other: lidocaine 4%, Treatment Notes Wound #1 (Left Trochanter) 1. Cleansed with: Clean wound with Normal Saline 4. Dressing Applied: Hydrafera Blue 5. Secondary Dressing Applied Bordered Foam Dressing Electronic Signature(s) Signed: 02/22/2017 3:34:25 PM By: Montey Hora Entered By: Montey Hora on 02/22/2017 12:55:05 [name] [name] ([id number]) -------------------------------------------------------------------------------- [place] Details Patient Name: [name] [name] Date of Service: 02/22/2017 12:30 PM Medical Record Number: [id number] Patient Account Number: [id number] Date of Birth/Sex: November 18, 1928 (83 y.o. Male) Treating RN: Montey Hora Primary Care Aigner Horseman: [name] Other Clinician: Referring Driana Dazey: [name] Treating Tereasa Yilmaz/Extender: Tito Dine in Treatment: 6 Vital Signs Time Taken: 12:36 Temperature (F): 97.4 Height (in): 71 Pulse (bpm): 87 Weight (lbs): 143 Respiratory Rate (breaths/min): 20 Body Mass Index (BMI): 19.9 Blood Pressure (mmHg): 121/58 Reference Range: 80 - 120 mg / dl Electronic Signature(s) Signed: 02/22/2017 3:34:25 PM By: Montey Hora Entered By: Montey Hora on 02/22/2017 12:38:28

## 2017-03-02 ENCOUNTER — Encounter: Payer: Medicare Other | Admitting: Internal Medicine

## 2017-03-02 DIAGNOSIS — L89220 Pressure ulcer of left hip, unstageable: Secondary | ICD-10-CM | POA: Diagnosis not present

## 2017-03-03 NOTE — Progress Notes (Signed)
TREA, LATNER ([id number]) Visit Report for 03/02/2017 Arrival Information Details Patient Name: [name] [name], [name] [name] Date of Service: 03/02/2017 1:30 PM Medical Record Number: [id number] Patient Account Number: [id number] Date of Birth/Sex: 22-May-1929 (83 y.o. Male) Treating RN: Montey Hora Primary Care Durene Dodge: [name] Other Clinician: Referring Aesha Agrawal: [name] Treating Jaren Vanetten/Extender: Tito Dine in Treatment: 8 Visit Information History Since Last Visit Added or deleted any medications: No Patient Arrived: Walker Any new allergies or adverse reactions: No Arrival Time: 13:32 Had a fall or experienced change in No Accompanied By: wife activities of daily living that may affect Transfer Assistance: None risk of falls: Patient Identification Verified: Yes Signs or symptoms of abuse/neglect since last No Secondary Verification Process Yes visito Completed: Hospitalized since last visit: No Patient Requires Transmission- No Has Dressing in Place as Prescribed: Yes Based Precautions: Pain Present Now: No Patient Has Alerts: Yes Patient Alerts: Patient on Blood Thinner Warfarin NOT Diabetic Electronic Signature(s) Signed: 03/02/2017 5:06:25 PM By: Montey Hora Entered By: Montey Hora on 03/02/2017 13:33:16 [name] [name] ([id number]) -------------------------------------------------------------------------------- Clinic Level of Care Assessment Details Patient Name: [name] [name] Date of Service: 03/02/2017 1:30 PM Medical Record Number: [id number] Patient Account Number: [id number] Date of Birth/Sex: 1929-04-12 (83 y.o. Male) Treating RN: Montey Hora Primary Care Yevonne Yokum: [name] Other Clinician: Referring Shirley Bolle: [name] Treating Eulonda Andalon/Extender: Tito Dine in Treatment: 8 Clinic Level of Care Assessment Items TOOL 4 Quantity Score _0  - Use when only an EandM is performed on FOLLOW-UP visit  0 ASSESSMENTS - Nursing Assessment / Reassessment X - Reassessment of Co-morbidities (includes updates in patient status) 1 10 X - Reassessment of Adherence to Treatment Plan 1 5 ASSESSMENTS - Wound and Skin Assessment / Reassessment X - Simple Wound Assessment / Reassessment - one wound 1 5 _1  - Complex Wound Assessment / Reassessment - multiple wounds 0 _2  - Dermatologic / Skin Assessment (not related to wound area) 0 ASSESSMENTS - Focused Assessment _3  - Circumferential Edema Measurements - multi extremities 0 _4  - Nutritional Assessment / Counseling / Intervention 0 _5  - Lower Extremity Assessment (monofilament, tuning fork, pulses) 0 _6  - Peripheral Arterial Disease Assessment (using hand held doppler) 0 ASSESSMENTS - Ostomy and/or Continence Assessment and Care _7  - Incontinence Assessment and Management 0 _8  - Ostomy Care Assessment and Management (repouching, etc.) 0 PROCESS - Coordination of Care X - Simple Patient / Family Education for ongoing care 1 15 _9  - Complex (extensive) Patient / Family Education for ongoing care 0 _10  - Staff obtains Programmer, systems, Records, Test Results / Process Orders 0 _11  - Staff telephones HHA, Nursing Homes / Clarify orders / etc 0 _12  - Routine Transfer to another Facility (non-emergent condition) 0 [name] [name], [name] [name] ([id number]) _13  - Routine Hospital Admission (non-emergent condition) 0 _14  - New Admissions / Biomedical engineer / Ordering NPWT, Apligraf, etc. 0 _15  - Emergency Hospital Admission (emergent condition) 0 X - Simple Discharge Coordination 1 10 _16  - Complex (extensive) Discharge Coordination 0 PROCESS - Special Needs _17  - Pediatric / Minor Patient Management 0 _18  - Isolation Patient Management 0 _19  - Hearing / Language / Visual special needs 0 _20  - Assessment of Community assistance (transportation, D/C planning, etc.) 0 _21  - Additional assistance / Altered mentation 0 _22  - Support Surface(s) Assessment (bed, cushion, seat, etc.)  0 INTERVENTIONS - Wound Cleansing / Measurement X - Simple Wound Cleansing - one wound 1 5 _23  - Complex Wound Cleansing - multiple wounds 0 X - Wound  Imaging (photographs - any number of wounds) 1 5 _0  - Wound Tracing (instead of photographs) 0 X - Simple Wound Measurement - one wound 1 5 _1  - Complex Wound Measurement - multiple wounds 0 INTERVENTIONS - Wound Dressings X - Small Wound Dressing one or multiple wounds 1 10 _2  - Medium Wound Dressing one or multiple wounds 0 _3  - Large Wound Dressing one or multiple wounds 0 <ZOXWRUEAVWUJWJXB>_1<\/YNWGNFAOZHYQMVHQ>_4  - Application of Medications - topical 0 <ONGEXBMWUXLKGMWN>_0<\/UVOZDGUYQIHKVQQV>_9  - Application of Medications - injection 0 INTERVENTIONS - Miscellaneous _6  - External ear exam 0 [name] [name], [name] [name] ([id number]) _7  - Specimen Collection (cultures, biopsies, blood, body fluids, etc.) 0 _8  - Specimen(s) / Culture(s) sent or taken to Lab for analysis 0 _9  - Patient Transfer (multiple staff / Harrel Lemon Lift / Similar devices) 0 _10  - Simple Staple / Suture removal (25 or less) 0 _11  - Complex Staple / Suture removal (26 or more) 0 _12  - Hypo / Hyperglycemic Management (close monitor of Blood Glucose) 0 _13  - Ankle / Brachial Index (ABI) - do not check if billed separately 0 X - Vital Signs 1 5 Has the patient been seen at the hospital within the last three years: Yes Total Score: 75 Level Of Care: New/Established - Level 2 Electronic Signature(s) Signed: 03/02/2017 5:06:25 PM By: Montey Hora Entered By: Montey Hora on 03/02/2017 14:11:56 [name] [name] ([id number]) -------------------------------------------------------------------------------- Encounter Discharge Information Details Patient Name: [name] [name] Date of Service: 03/02/2017 1:30 PM Medical Record Number: [id number] Patient Account Number: [id number] Date of Birth/Sex: Nov 17, 1928 (83 y.o. Male) Treating RN: Montey Hora Primary Care Kamill Fulbright: [name] Other Clinician: Referring Johnhenry Tippin: [name] Treating  Madina Galati/Extender: Tito Dine in Treatment: 8 Encounter Discharge Information Items Discharge Pain Level: 0 Discharge Condition: Stable Ambulatory Status: Walker Discharge Destination: Home Transportation: Private Auto Accompanied By: spouse Schedule Follow-up Appointment: Yes Medication Reconciliation completed No and provided to Patient/Care Brion Hedges: Provided on Clinical Summary of Care: 03/02/2017 Form Type Recipient Paper Patient CG Electronic Signature(s) Signed: 03/02/2017 2:13:01 PM By: Montey Hora Previous Signature: 03/02/2017 1:52:05 PM Version By: Ruthine Dose Entered By: Montey Hora on 03/02/2017 14:13:01 [name] [name] ([id number]) -------------------------------------------------------------------------------- Multi Wound Chart Details Patient Name: [name] [name] Date of Service: 03/02/2017 1:30 PM Medical Record Number: [id number] Patient Account Number: [id number] Date of Birth/Sex: [birth year] (83 y.o. Male) Treating RN: Montey Hora Primary Care Detron Carras: [name] Other Clinician: Referring Kane Kusek: [name] Treating Alexianna Nachreiner/Extender: Tito Dine in Treatment: 8 Vital Signs Height(in): 71 Pulse(bpm): 87 Weight(lbs): 143 Blood Pressure 107/57 (mmHg): Body Mass Index(BMI): 20 Temperature(F): 97.7 Respiratory Rate 20 (breaths/min): Photos: [1:No Photos] [N/A:N/A] Wound Location: [1:Left Trochanter] [N/A:N/A] Wounding Event: [1:Pressure Injury] [N/A:N/A] Primary Etiology: [1:Pressure Ulcer] [N/A:N/A] Comorbid History: [1:Cataracts, Chronic Obstructive Pulmonary Disease (COPD), Arrhythmia, Congestive Heart Failure, Coronary Artery Disease, Hypotension, Myocardial Infarction, Gout, Osteoarthritis] [N/A:N/A] Date Acquired: [1:12/22/2016] [N/A:N/A] Weeks of Treatment: [1:8] [N/A:N/A] Wound Status: [1:Open] [N/A:N/A] Measurements L x W x D 0.2x0.3x0.1 [N/A:N/A] (cm) Area (cm) : [1:0.047]  [N/A:N/A] Volume (cm) : [1:0.005] [N/A:N/A] % Reduction in Area: [1:85.80%] [N/A:N/A] % Reduction in Volume: 84.80% [N/A:N/A] Classification: [1:Unstageable/Unclassified] [N/A:N/A] Exudate Amount: [1:Large] [N/A:N/A] Exudate Type: [1:Serous] [N/A:N/A] Exudate Color: [1:amber] [N/A:N/A] Wound Margin: [1:Flat and Intact] [N/A:N/A] Granulation Amount: [1:Large (67-100%)] [N/A:N/A] Granulation Quality: [1:Pink] [N/A:N/A] Necrotic Amount: [1:None Present (0%)] [N/A:N/A] Exposed Structures: Fascia: No N/A N/A Fat Layer (Subcutaneous Tissue) Exposed: No Tendon: No Muscle: No Joint: No Bone: No Limited to Skin Breakdown Epithelialization: Small (1-33%) N/A N/A Periwound Skin Texture:  Excoriation: No N/A N/A Induration: No Callus: No Crepitus: No Rash: No Scarring: No Periwound Skin Maceration: No N/A N/A Moisture: Dry/Scaly: No Periwound Skin Color: Atrophie Blanche: No N/A N/A Cyanosis: No Ecchymosis: No Erythema: No Hemosiderin Staining: No Mottled: No Pallor: No Rubor: No Tenderness on No N/A N/A Palpation: Wound Preparation: Ulcer Cleansing: N/A N/A Rinsed/Irrigated with Saline Topical Anesthetic Applied: Other: lidocaine 4% Treatment Notes Electronic Signature(s) Signed: 03/02/2017 4:44:14 PM By: Linton Ham MD Entered By: Linton Ham on 03/02/2017 13:52:56 [name] [name] ([id number]) -------------------------------------------------------------------------------- [place] Details Patient Name: [name] [name] Date of Service: 03/02/2017 1:30 PM Medical Record Number: [id number] Patient Account Number: [id number] Date of Birth/Sex: 06/05/1929 (83 y.o. Male) Treating RN: Montey Hora Primary Care Quindell Shere: [name] Other Clinician: Referring Dorissa Stinnette: [name] Treating Sukanya Goldblatt/Extender: Tito Dine in Treatment: 8 Active Inactive ` Abuse / Safety / Falls / Self Care Management Nursing  Diagnoses: Impaired physical mobility Potential for falls Goals: Patient/caregiver will verbalize understanding of skin care regimen Date Initiated: 01/05/2017 Target Resolution Date: 03/29/2017 Goal Status: Active Patient/caregiver will verbalize/demonstrate measures taken to prevent injury and/or falls Date Initiated: 01/05/2017 Target Resolution Date: 03/29/2017 Goal Status: Active Interventions: Assess fall risk on admission and as needed Assess self care needs on admission and as needed Notes: ` Orientation to the Wound Care Program Nursing Diagnoses: Knowledge deficit related to the wound healing center program Goals: Patient/caregiver will verbalize understanding of the [facility] Program Date Initiated: 01/05/2017 Target Resolution Date: 03/29/2017 Goal Status: Active Interventions: Provide education on orientation to the wound center Notes: [name] [name], [name] [name] ([id number]) Pressure Nursing Diagnoses: Knowledge deficit related to management of pressures ulcers Goals: Patient will remain free of pressure ulcers Date Initiated: 01/05/2017 Target Resolution Date: 03/29/2017 Goal Status: Active Patient/caregiver will verbalize understanding of pressure ulcer management Date Initiated: 01/05/2017 Target Resolution Date: 03/29/2017 Goal Status: Active Interventions: Assess offloading mechanisms upon admission and as needed Notes: ` Wound/Skin Impairment Nursing Diagnoses: Knowledge deficit related to ulceration/compromised skin integrity Goals: Ulcer/skin breakdown will heal within 14 weeks Date Initiated: 01/05/2017 Target Resolution Date: 04/06/2017 Goal Status: Active Interventions: Assess patient/caregiver ability to perform ulcer/skin care regimen upon admission and as needed Assess ulceration(s) every visit Treatment Activities: Skin care regimen initiated : 01/05/2017 Topical wound management initiated : 01/05/2017 Notes: Electronic Signature(s) Signed:  03/02/2017 5:06:25 PM By: Montey Hora Entered By: Montey Hora on 03/02/2017 13:43:02 [name] [name] ([id number]) -------------------------------------------------------------------------------- Pain Assessment Details Patient Name: [name] [name] Date of Service: 03/02/2017 1:30 PM Medical Record Number: [id number] Patient Account Number: [id number] Date of Birth/Sex: 01-Mar-1929 (83 y.o. Male) Treating RN: Montey Hora Primary Care Majestic Brister: [name] Other Clinician: Referring Jazmeen Axtell: [name] Treating Teagen Mcleary/Extender: Tito Dine in Treatment: 8 Active Problems Location of Pain Severity and Description of Pain Patient Has Paino No Site Locations Pain Management and Medication Current Pain Management: Electronic Signature(s) Signed: 03/02/2017 5:06:25 PM By: Montey Hora Entered By: Montey Hora on 03/02/2017 13:33:25 [name] [name] ([id number]) -------------------------------------------------------------------------------- Patient/Caregiver Education Details Patient Name: [name] [name] Date of Service: 03/02/2017 1:30 PM Medical Record Number: [id number] Patient Account Number: [id number] Date of Birth/Gender: 04-03-1929 (83 y.o. Male) Treating RN: Montey Hora Primary Care Physician: [name] Other Clinician: Referring Physician: [name] Treating Physician/Extender: Tito Dine in Treatment: 8 Education Assessment Education Provided To: Patient and Caregiver Education Topics Provided Wound/Skin Impairment: Handouts: Other: wound care as ordered Methods: Demonstration, Explain/Verbal Responses: State content correctly Electronic Signature(s) Signed: 03/02/2017  5:06:25 PM By: Montey Hora Entered By: Montey Hora on 03/02/2017 14:13:30 [name] [name] ([id number]) -------------------------------------------------------------------------------- Wound Assessment Details Patient Name:  [name] [name] Date of Service: 03/02/2017 1:30 PM Medical Record Number: [id number] Patient Account Number: [id number] Date of Birth/Sex: 1929-03-14 (83 y.o. Male) Treating RN: Montey Hora Primary Care Tondra Reierson: [name] Other Clinician: Referring Azalya Galyon: [name] Treating Ruthel Martine/Extender: Tito Dine in Treatment: 8 Wound Status Wound Number: 1 Primary Pressure Ulcer Etiology: Wound Location: Left Trochanter Wound Open Wounding Event: Pressure Injury Status: Date Acquired: 12/22/2016 Comorbid Cataracts, Chronic Obstructive Weeks Of Treatment: 8 History: Pulmonary Disease (COPD), Arrhythmia, Clustered Wound: No Congestive Heart Failure, Coronary Artery Disease, Hypotension, Myocardial Infarction, Gout, Osteoarthritis Photos Photo Uploaded By: Montey Hora on 03/02/2017 14:20:31 Wound Measurements Length: (cm) 0.2 Width: (cm) 0.3 Depth: (cm) 0.1 Area: (cm) 0.047 Volume: (cm) 0.005 % Reduction in Area: 85.8% % Reduction in Volume: 84.8% Epithelialization: Small (1-33%) Tunneling: No Undermining: No Wound Description Classification: Unstageable/Unclassified Wound Margin: Flat and Intact Exudate Amount: Large Exudate Type: Serous Exudate Color: amber Foul Odor After Cleansing: No Slough/Fibrino Yes Wound Bed Granulation Amount: Large (67-100%) Exposed Structure Granulation Quality: Pink Fascia Exposed: No [name] [name], [name] [name] ([id number]) Necrotic Amount: None Present (0%) Fat Layer (Subcutaneous Tissue) Exposed: No Tendon Exposed: No Muscle Exposed: No Joint Exposed: No Bone Exposed: No Limited to Skin Breakdown Periwound Skin Texture Texture Color No Abnormalities Noted: No No Abnormalities Noted: No Callus: No Atrophie Blanche: No Crepitus: No Cyanosis: No Excoriation: No Ecchymosis: No Induration: No Erythema: No Rash: No Hemosiderin Staining: No Scarring: No Mottled: No Pallor: No Moisture Rubor: No No  Abnormalities Noted: No Dry / Scaly: No Maceration: No Wound Preparation Ulcer Cleansing: Rinsed/Irrigated with Saline Topical Anesthetic Applied: Other: lidocaine 4%, Treatment Notes Wound #1 (Left Trochanter) 1. Cleansed with: Clean wound with Normal Saline 2. Anesthetic Topical Lidocaine 4% cream to wound bed prior to debridement 4. Dressing Applied: Hydrafera Blue 5. Secondary Dressing Applied Bordered Foam Dressing Electronic Signature(s) Signed: 03/02/2017 5:06:25 PM By: Montey Hora Entered By: Montey Hora on 03/02/2017 13:42:52 [name] [name] ([id number]) -------------------------------------------------------------------------------- [place] Details Patient Name: [name] [name] Date of Service: 03/02/2017 1:30 PM Medical Record Number: [id number] Patient Account Number: [id number] Date of Birth/Sex: [birth year] (83 y.o. Male) Treating RN: Montey Hora Primary Care Jomo Forand: [name] Other Clinician: Referring Tennyson Kallen: [name] Treating Caci Orren/Extender: Tito Dine in Treatment: 8 Vital Signs Time Taken: 13:33 Temperature (F): 97.7 Height (in): 71 Pulse (bpm): 87 Weight (lbs): 143 Respiratory Rate (breaths/min): 20 Body Mass Index (BMI): 19.9 Blood Pressure (mmHg): 107/57 Reference Range: 80 - 120 mg / dl Electronic Signature(s) Signed: 03/02/2017 5:06:25 PM By: Montey Hora Entered By: Montey Hora on 03/02/2017 13:35:49

## 2017-03-03 NOTE — Progress Notes (Signed)
SHIGEO, BAUGH ([id number]) Visit Report for 03/02/2017 Chief Complaint Document Details Patient Name: [name] [name], [name] [name] Date of Service: 03/02/2017 1:30 PM Medical Record Number: [id number] Patient Account Number: [id number] Date of Birth/Sex: 24-Oct-1928 (83 y.o. Male) Treating RN: Montey Hora Primary Care Provider: [name] Other Clinician: Referring Provider: [name] Treating Provider/Extender: Tito Dine in Treatment: 8 Information Obtained from: Patient Chief Complaint The patient is here for initial evaluation of his left trocanter pressure ulcer Electronic Signature(s) Signed: 03/02/2017 4:44:14 PM By: Linton Ham MD Entered By: Linton Ham on 03/02/2017 13:53:08 [name] [name] ([id number]) -------------------------------------------------------------------------------- HPI Details Patient Name: [name] [name] Date of Service: 03/02/2017 1:30 PM Medical Record Number: [id number] Patient Account Number: [id number] Date of Birth/Sex: [birth year] (83 y.o. Male) Treating RN: Montey Hora Primary Care Provider: [name] Other Clinician: Referring Provider: [name] Treating Provider/Extender: Tito Dine in Treatment: 8 History of Present Illness Location: left trocanter Quality: denies pain Duration: the ulcer was noticed two weeks ago Context: ulcer is secondary to pressure Modifying Factors: unrelieved pressure is an exacerbating factor HPI Description: 01/05/17- the patient is here for initial violation of a left trochanter pressure ulcer that was originally noticed by his wife a few weeks ago. He states that he traditionally sleeps on his left side. He has lost between 20 and 30 pounds of the past 6 months. He cannot articulate a specific reason for this he states that it's not a loss of appetite. He denies getting fatigued with eating, despite having pulmonary fibrosis, COPD and being exertionally oxygen  dependent. They have been applying neosporin. Lab work obtained in January revealed an albumin level 3.7. He is a non-diabetic, former smoker (quitting > 40 yrs ago) 01/12/17; left trochanter ulcer. smaller using iodosorb which was started last week. He is hypoxic. does not keep O2 on reliable at least here. When this happen P02 drops in the 70s 01/19/17; left trochanteric ulcer using Iodosorb about half the size of last week. O2 sat is 96% on 2 L respiratory status seems more stable 01/26/17 wouind measures slightly larger this week. non viable surface. Using iodosorb 02/02/17; again measurements unchanged this week. Non-viable surface. We had been using Iodosorb with some improvement I switched off the Prisma last week but I can't really understand why that was done. 02/09/17; dimensions are smaller left greater trochanter. Still in nonviable surface. We've been using Iodosorb. Fortunately the stage I pressure area over the right hip has resolved. 02/15/17; patient arrived in the clinic with low blood pressure. He is on metoprolol and lisinopril but the doses are unclear. Also takes torsemide 20 twice a day. He is on these largely for cardiac issues not particularly hypertension and in discussion with his wife this marginal blood pressure issue has been something that is been noted in the past. 02/22/17; 0.5x0.7x0.1 slightly larger than last week. 03/02/17; 0.2 x 0.3 x 0.1 much smaller than last week and much more superficial Electronic Signature(s) Signed: 03/02/2017 4:44:14 PM By: Linton Ham MD Entered By: Linton Ham on 03/02/2017 13:54:02 [name] [name] ([id number]) -------------------------------------------------------------------------------- Physical Exam Details Patient Name: [name] [name] Date of Service: 03/02/2017 1:30 PM Medical Record Number: [id number] Patient Account Number: [id number] Date of Birth/Sex: 1929-05-06 (83 y.o. Male) Treating RN: Montey Hora Primary  Care Provider: [name] Other Clinician: Referring Provider: [name] Treating Provider/Extender: Ricard Dillon Weeks in Treatment: 8 Constitutional Sitting or standing Blood Pressure is within target range for patient.. Pulse regular and within target  range for patient.Marland Kitchen Respirations regular, non-labored and within target range.. Temperature is normal and within the target range for the patient.Marland Kitchen appears in no distress. Eyes Conjunctivae clear. No discharge. Respiratory Slightly tachypneic. Work of breathing appears normal. Integumentary (Hair, Skin) No surrounding erythema and no signs of infection. Psychiatric No evidence of depression, anxiety, or agitation. Calm, cooperative, and communicative. Appropriate interactions and affect.. Notes Wound exam; the wound is much smaller than last week and has a superficial healthy base. No surrounding erythema no crepitus Electronic Signature(s) Signed: 03/02/2017 4:44:14 PM By: Linton Ham MD Entered By: Linton Ham on 03/02/2017 13:55:46 [name] [name] ([id number]) -------------------------------------------------------------------------------- Physician Orders Details Patient Name: [name] [name] Date of Service: 03/02/2017 1:30 PM Medical Record Number: [id number] Patient Account Number: [id number] Date of Birth/Sex: [birth year] (83 y.o. Male) Treating RN: Montey Hora Primary Care Provider: [name] Other Clinician: Referring Provider: [name] Treating Provider/Extender: Tito Dine in Treatment: 8 Verbal / Phone Orders: No Diagnosis Coding Wound Cleansing Wound #1 Left Trochanter o May Shower, gently pat wound dry prior to applying new dressing. Anesthetic Wound #1 Left Trochanter o Topical Lidocaine 4% cream applied to wound bed prior to debridement - in clinic only Skin Barriers/Peri-Wound Care Wound #1 Left Trochanter o Skin Prep Primary Wound Dressing Wound #1  Left Trochanter o Hydrafera Blue Secondary Dressing Wound #1 Left Trochanter o Dry Gauze o Boardered Foam Dressing Dressing Change Frequency Wound #1 Left Trochanter o Change dressing every other day. Follow-up Appointments Wound #1 Left Trochanter o Return Appointment in 1 week. Off-Loading Wound #1 Left Trochanter o Turn and reposition every 2 hours - Try to stay off of left hip. Additional Orders / Instructions ASRIEL, WESTRUP ([id number]) Wound #1 Left Trochanter o Other: - Do not leave home without Oxygen. Electronic Signature(s) Signed: 03/02/2017 4:44:14 PM By: Linton Ham MD Signed: 03/02/2017 5:06:25 PM By: Montey Hora Entered By: Montey Hora on 03/02/2017 13:43:35 [name] [name] ([id number]) -------------------------------------------------------------------------------- Problem List Details Patient Name: [name] [name] Date of Service: 03/02/2017 1:30 PM Medical Record Number: [id number] Patient Account Number: [id number] Date of Birth/Sex: Feb 28, 1929 (83 y.o. Male) Treating RN: Montey Hora Primary Care Provider: [name] Other Clinician: Referring Provider: [name] Treating Provider/Extender: Ricard Dillon Weeks in Treatment: 8 Active Problems ICD-10 Encounter Code Description Active Date Diagnosis L89.220 Pressure ulcer of left hip, unstageable 01/05/2017 Yes R63.4 Abnormal weight loss 01/05/2017 Yes J84.10 Pulmonary fibrosis, unspecified 01/05/2017 Yes Inactive Problems Resolved Problems Electronic Signature(s) Signed: 03/02/2017 4:44:14 PM By: Linton Ham MD Entered By: Linton Ham on 03/02/2017 13:51:58 [name] [name] ([id number]) -------------------------------------------------------------------------------- Progress Note Details Patient Name: [name] [name] Date of Service: 03/02/2017 1:30 PM Medical Record Number: [id number] Patient Account Number: [id number] Date of Birth/Sex: 29-Apr-1929  (83 y.o. Male) Treating RN: Montey Hora Primary Care Provider: [name] Other Clinician: Referring Provider: [name] Treating Provider/Extender: Tito Dine in Treatment: 8 Subjective Chief Complaint Information obtained from Patient The patient is here for initial evaluation of his left trocanter pressure ulcer History of Present Illness (HPI) The following HPI elements were documented for the patient's wound: Location: left trocanter Quality: denies pain Duration: the ulcer was noticed two weeks ago Context: ulcer is secondary to pressure Modifying Factors: unrelieved pressure is an exacerbating factor 01/05/17- the patient is here for initial violation of a left trochanter pressure ulcer that was originally noticed by his wife a few weeks ago. He states that he traditionally sleeps on his left side. He has  lost between 20 and 30 pounds of the past 6 months. He cannot articulate a specific reason for this he states that it's not a loss of appetite. He denies getting fatigued with eating, despite having pulmonary fibrosis, COPD and being exertionally oxygen dependent. They have been applying neosporin. Lab work obtained in January revealed an albumin level 3.7. He is a non-diabetic, former smoker (quitting > 40 yrs ago) 01/12/17; left trochanter ulcer. smaller using iodosorb which was started last week. He is hypoxic. does not keep O2 on reliable at least here. When this happen P02 drops in the 70s 01/19/17; left trochanteric ulcer using Iodosorb about half the size of last week. O2 sat is 96% on 2 L respiratory status seems more stable 01/26/17 wouind measures slightly larger this week. non viable surface. Using iodosorb 02/02/17; again measurements unchanged this week. Non-viable surface. We had been using Iodosorb with some improvement I switched off the Prisma last week but I can't really understand why that was done. 02/09/17; dimensions are smaller left greater  trochanter. Still in nonviable surface. We've been using Iodosorb. Fortunately the stage I pressure area over the right hip has resolved. 02/15/17; patient arrived in the clinic with low blood pressure. He is on metoprolol and lisinopril but the doses are unclear. Also takes torsemide 20 twice a day. He is on these largely for cardiac issues not particularly hypertension and in discussion with his wife this marginal blood pressure issue has been something that is been noted in the past. 02/22/17; 0.5x0.7x0.1 slightly larger than last week. 03/02/17; 0.2 x 0.3 x 0.1 much smaller than last week and much more superficial JAMARIE, MUSSA. ([id number]) Objective Constitutional Sitting or standing Blood Pressure is within target range for patient.. Pulse regular and within target range for patient.Marland Kitchen Respirations regular, non-labored and within target range.. Temperature is normal and within the target range for the patient.Marland Kitchen appears in no distress. Vitals Time Taken: 1:33 PM, Height: 71 in, Weight: 143 lbs, BMI: 19.9, Temperature: 97.7 F, Pulse: 87 bpm, Respiratory Rate: 20 breaths/min, Blood Pressure: 107/57 mmHg. Eyes Conjunctivae clear. No discharge. Respiratory Slightly tachypneic. Work of breathing appears normal. Psychiatric No evidence of depression, anxiety, or agitation. Calm, cooperative, and communicative. Appropriate interactions and affect.. General Notes: Wound exam; the wound is much smaller than last week and has a superficial healthy base. No surrounding erythema no crepitus Integumentary (Hair, Skin) No surrounding erythema and no signs of infection. Wound #1 status is Open. Original cause of wound was Pressure Injury. The wound is located on the Left Trochanter. The wound measures 0.2cm length x 0.3cm width x 0.1cm depth; 0.047cm^2 area and 0.005cm^3 volume. The wound is limited to skin breakdown. There is no tunneling or undermining noted. There is a large amount of  serous drainage noted. The wound margin is flat and intact. There is large (67- 100%) pink granulation within the wound bed. There is no necrotic tissue within the wound bed. The periwound skin appearance did not exhibit: Callus, Crepitus, Excoriation, Induration, Rash, Scarring, Dry/Scaly, Maceration, Atrophie Blanche, Cyanosis, Ecchymosis, Hemosiderin Staining, Mottled, Pallor, Rubor, Erythema. Assessment Active Problems ICD-10 L89.220 - Pressure ulcer of left hip, unstageable R63.4 - Abnormal weight loss CHURCHILL, GRIMSLEY ([id number]) J84.10 - Pulmonary fibrosis, unspecified Plan Wound Cleansing: Wound #1 Left Trochanter: May Shower, gently pat wound dry prior to applying new dressing. Anesthetic: Wound #1 Left Trochanter: Topical Lidocaine 4% cream applied to wound bed prior to debridement - in clinic only Skin Barriers/Peri-Wound Care: Wound #1 Left  Trochanter: Skin Prep Primary Wound Dressing: Wound #1 Left Trochanter: Hydrafera Blue Secondary Dressing: Wound #1 Left Trochanter: Dry Gauze Boardered Foam Dressing Dressing Change Frequency: Wound #1 Left Trochanter: Change dressing every other day. Follow-up Appointments: Wound #1 Left Trochanter: Return Appointment in 1 week. Off-Loading: Wound #1 Left Trochanter: Turn and reposition every 2 hours - Try to stay off of left hip. Additional Orders / Instructions: Wound #1 Left Trochanter: Other: - Do not leave home without Oxygen. #1 wound appears to be contracting towards closure #2 hydrofera blue to continue Electronic Signature(s) LEVAUGHN, PUCCINELLI ([id number]) Signed: 03/02/2017 4:44:14 PM By: Linton Ham MD Entered By: Linton Ham on 03/02/2017 13:57:57 [name] [name] ([id number]) -------------------------------------------------------------------------------- SuperBill Details Patient Name: [name] [name] Date of Service: 03/02/2017 Medical Record Number: [id number] Patient Account Number:  [id number] Date of Birth/Sex: [birth year] (83 y.o. Male) Treating RN: Montey Hora Primary Care Provider: [name] Other Clinician: Referring Provider: [name] Treating Provider/Extender: Tito Dine in Treatment: 8 Diagnosis Coding ICD-10 Codes Code Description [phone] Pressure ulcer of left hip, unstageable R63.4 Abnormal weight loss J84.10 Pulmonary fibrosis, unspecified Facility Procedures CPT4 Code: [id number] Description: [phone] - WOUND CARE VISIT-LEV 2 EST PT Modifier: Quantity: 1 Physician Procedures CPT4 Code: [id number] Description: [id number] - WC PHYS LEVEL 3 - EST PT ICD-10 Description Diagnosis L89.220 Pressure ulcer of left hip, unstageable Modifier: Quantity: 1 Electronic Signature(s) Signed: 03/02/2017 2:12:07 PM By: Montey Hora Signed: 03/02/2017 4:44:14 PM By: Linton Ham MD Entered By: Montey Hora on 03/02/2017 14:12:06

## 2017-03-08 ENCOUNTER — Encounter: Payer: Medicare Other | Attending: Internal Medicine | Admitting: Internal Medicine

## 2017-03-08 DIAGNOSIS — R634 Abnormal weight loss: Secondary | ICD-10-CM | POA: Insufficient documentation

## 2017-03-08 DIAGNOSIS — Z87891 Personal history of nicotine dependence: Secondary | ICD-10-CM | POA: Insufficient documentation

## 2017-03-08 DIAGNOSIS — J449 Chronic obstructive pulmonary disease, unspecified: Secondary | ICD-10-CM | POA: Insufficient documentation

## 2017-03-08 DIAGNOSIS — J8410 Pulmonary fibrosis, unspecified: Secondary | ICD-10-CM | POA: Insufficient documentation

## 2017-03-08 DIAGNOSIS — Z9981 Dependence on supplemental oxygen: Secondary | ICD-10-CM | POA: Insufficient documentation

## 2017-03-08 DIAGNOSIS — L89220 Pressure ulcer of left hip, unstageable: Secondary | ICD-10-CM | POA: Insufficient documentation

## 2017-03-09 NOTE — Progress Notes (Signed)
[name], [name] ([id number]) Visit Report for 03/08/2017 Arrival Information Details Patient Name: [name], [name] Date of Service: 03/08/2017 1:30 PM Medical Record Number: [id number] Patient Account Number: [id number] Date of Birth/Sex: 02/15/1929 (83 y.o. Male) Treating RN: Montey Hora Primary Care Aayra Hornbaker: [name] Other Clinician: Referring Dallis Czaja: [name] Treating Devario Bucklew/Extender: Tito Dine in Treatment: 8 Visit Information History Since Last Visit Added or deleted any medications: No Patient Arrived: Walker Any new allergies or adverse reactions: No Arrival Time: 13:39 Had a fall or experienced change in No Accompanied By: spouse activities of daily living that may affect Transfer Assistance: None risk of falls: Patient Identification Verified: Yes Signs or symptoms of abuse/neglect since last No Secondary Verification Process Yes visito Completed: Hospitalized since last visit: No Patient Requires Transmission- No Has Dressing in Place as Prescribed: Yes Based Precautions: Pain Present Now: No Patient Has Alerts: Yes Patient Alerts: Patient on Blood Thinner Warfarin NOT Diabetic Electronic Signature(s) Signed: 03/08/2017 4:59:08 PM By: Montey Hora Entered By: Montey Hora on 03/08/2017 13:40:10 [name] [name] ([id number]) -------------------------------------------------------------------------------- Clinic Level of Care Assessment Details Patient Name: [name] [name] Date of Service: 03/08/2017 1:30 PM Medical Record Number: [id number] Patient Account Number: [id number] Date of Birth/Sex: 1928-11-07 (83 y.o. Male) Treating RN: Montey Hora Primary Care Alonzo Loving: [name] Other Clinician: Referring Viliami Bracco: [name] Treating Treazure Nery/Extender: Tito Dine in Treatment: 8 Clinic Level of Care Assessment Items TOOL 4 Quantity Score _0  - Use when only an EandM is performed on FOLLOW-UP visit  0 ASSESSMENTS - Nursing Assessment / Reassessment X - Reassessment of Co-morbidities (includes updates in patient status) 1 10 X - Reassessment of Adherence to Treatment Plan 1 5 ASSESSMENTS - Wound and Skin Assessment / Reassessment X - Simple Wound Assessment / Reassessment - one wound 1 5 _1  - Complex Wound Assessment / Reassessment - multiple wounds 0 _2  - Dermatologic / Skin Assessment (not related to wound area) 0 ASSESSMENTS - Focused Assessment _3  - Circumferential Edema Measurements - multi extremities 0 _4  - Nutritional Assessment / Counseling / Intervention 0 _5  - Lower Extremity Assessment (monofilament, tuning fork, pulses) 0 _6  - Peripheral Arterial Disease Assessment (using hand held doppler) 0 ASSESSMENTS - Ostomy and/or Continence Assessment and Care _7  - Incontinence Assessment and Management 0 _8  - Ostomy Care Assessment and Management (repouching, etc.) 0 PROCESS - Coordination of Care X - Simple Patient / Family Education for ongoing care 1 15 _9  - Complex (extensive) Patient / Family Education for ongoing care 0 _10  - Staff obtains Programmer, systems, Records, Test Results / Process Orders 0 _11  - Staff telephones HHA, Nursing Homes / Clarify orders / etc 0 _12  - Routine Transfer to another Facility (non-emergent condition) 0 [name], [name] ([id number]) _13  - Routine Hospital Admission (non-emergent condition) 0 _14  - New Admissions / Biomedical engineer / Ordering NPWT, Apligraf, etc. 0 _15  - Emergency Hospital Admission (emergent condition) 0 X - Simple Discharge Coordination 1 10 _16  - Complex (extensive) Discharge Coordination 0 PROCESS - Special Needs _17  - Pediatric / Minor Patient Management 0 _18  - Isolation Patient Management 0 _19  - Hearing / Language / Visual special needs 0 _20  - Assessment of Community assistance (transportation, D/C planning, etc.) 0 _21  - Additional assistance / Altered mentation 0 _22  - Support Surface(s) Assessment (bed, cushion, seat, etc.)  0 INTERVENTIONS - Wound Cleansing / Measurement X - Simple Wound Cleansing - one wound 1 5 _23  - Complex Wound Cleansing - multiple wounds 0 X - Wound  Imaging (photographs - any number of wounds) 1 5 _0  - Wound Tracing (instead of photographs) 0 X - Simple Wound Measurement - one wound 1 5 _1  - Complex Wound Measurement - multiple wounds 0 INTERVENTIONS - Wound Dressings _2  - Small Wound Dressing one or multiple wounds 0 _3  - Medium Wound Dressing one or multiple wounds 0 _4  - Large Wound Dressing one or multiple wounds 0 <PIRJJOACZYSAYTKZ>_6<\/WFUXNATFTDDUKGUR>_4  - Application of Medications - topical 0 <YHCWCBJSEGBTDVVO>_1<\/YWVPXTGGYIRSWNIO>_2  - Application of Medications - injection 0 INTERVENTIONS - Miscellaneous _7  - External ear exam 0 [name], [name] ([id number]) _8  - Specimen Collection (cultures, biopsies, blood, body fluids, etc.) 0 _9  - Specimen(s) / Culture(s) sent or taken to Lab for analysis 0 _10  - Patient Transfer (multiple staff / Harrel Lemon Lift / Similar devices) 0 _11  - Simple Staple / Suture removal (25 or less) 0 _12  - Complex Staple / Suture removal (26 or more) 0 _13  - Hypo / Hyperglycemic Management (close monitor of Blood Glucose) 0 _14  - Ankle / Brachial Index (ABI) - do not check if billed separately 0 X - Vital Signs 1 5 Has the patient been seen at the hospital within the last three years: Yes Total Score: 65 Level Of Care: New/Established - Level 2 Electronic Signature(s) Signed: 03/08/2017 4:59:08 PM By: Montey Hora Entered By: Montey Hora on 03/08/2017 14:37:18 [name] [name] ([id number]) -------------------------------------------------------------------------------- Encounter Discharge Information Details Patient Name: [name] [name] Date of Service: 03/08/2017 1:30 PM Medical Record Number: [id number] Patient Account Number: [id number] Date of Birth/Sex: [birth year] (83 y.o. Male) Treating RN: Montey Hora Primary Care Aleece Loyd: [name] Other Clinician: Referring Addie Alonge: [name] Treating  Thailyn Khalid/Extender: Tito Dine in Treatment: 8 Encounter Discharge Information Items Discharge Pain Level: 0 Discharge Condition: Stable Ambulatory Status: Walker Discharge Destination: Home Private Transportation: Auto Accompanied By: spouse Schedule Follow-up Appointment: No Medication Reconciliation completed and No provided to Patient/Care Anna-Marie Coller: Clinical Summary of Care: Electronic Signature(s) Signed: 03/08/2017 4:59:08 PM By: Montey Hora Entered By: Montey Hora on 03/08/2017 13:57:55 [name] [name] ([id number]) -------------------------------------------------------------------------------- [place] Details Patient Name: [name] [name] Date of Service: 03/08/2017 1:30 PM Medical Record Number: [id number] Patient Account Number: [id number] Date of Birth/Sex: 1929-09-11 (83 y.o. Male) Treating RN: Montey Hora Primary Care Lauramae Kneisley: [name] Other Clinician: Referring Nalu Troublefield: [name] Treating Paw Karstens/Extender: Tito Dine in Treatment: 8 Active Inactive Electronic Signature(s) Signed: 03/08/2017 4:59:08 PM By: Montey Hora Entered By: Montey Hora on 03/08/2017 13:57:06 [name] [name] ([id number]) -------------------------------------------------------------------------------- Pain Assessment Details Patient Name: [name] [name] Date of Service: 03/08/2017 1:30 PM Medical Record Number: [id number] Patient Account Number: [id number] Date of Birth/Sex: 1929-01-20 (83 y.o. Male) Treating RN: Montey Hora Primary Care Maryon Kemnitz: [name] Other Clinician: Referring Bali Lyn: [name] Treating Berkley Cronkright/Extender: Tito Dine in Treatment: 8 Active Problems Location of Pain Severity and Description of Pain Patient Has Paino No Site Locations Pain Management and Medication Current Pain Management: Electronic Signature(s) Signed: 03/08/2017 4:59:08 PM By: Montey Hora Entered By: Montey Hora on 03/08/2017 13:40:19 [name] [name] ([id number]) -------------------------------------------------------------------------------- Patient/Caregiver Education Details Patient Name: [name] [name] Date of Service: 03/08/2017 1:30 PM Medical Record Patient Account Number: [id number] Number: Treating RN: Montey Hora 28-Jul-1929 (83 y.o. Other Clinician: Date of Birth/Gender: Male) Treating ROBSON, [place] Primary Care Physician: [name] Physician/Extender: G Referring Physician: Melina Modena in Treatment: 8 Education Assessment Education Provided To: Patient and Caregiver Education Topics Provided Offloading: Handouts: Other: continue offloading Methods: Explain/Verbal Responses: State  content correctly Electronic Signature(s) Signed: 03/08/2017 4:59:08 PM By: Montey Hora Entered By: Montey Hora on 03/08/2017 13:58:20 [name] [name] ([id number]) -------------------------------------------------------------------------------- Wound Assessment Details Patient Name: [name] [name] Date of Service: 03/08/2017 1:30 PM Medical Record Number: [id number] Patient Account Number: [id number] Date of Birth/Sex: 08/22/1929 (83 y.o. Male) Treating RN: Montey Hora Primary Care Wilhelm Ganaway: [name] Other Clinician: Referring Guy Toney: [name] Treating Erionna Strum/Extender: Ricard Dillon Weeks in Treatment: 8 Wound Status Wound Number: 1 Primary Etiology: Pressure Ulcer Wound Location: Left Trochanter Wound Status: Healed - Epithelialized Wounding Event: Pressure Injury Date Acquired: 12/22/2016 Weeks Of Treatment: 8 Clustered Wound: No Photos Photo Uploaded By: Montey Hora on 03/08/2017 14:39:58 Wound Measurements Length: (cm) 0 % Reduction in Width: (cm) 0 % Reduction in Depth: (cm) 0 Area: (cm) 0 Volume: (cm) 0 Area: 100% Volume: 100% Wound Description Classification:  Unstageable/Unclassified Periwound Skin Texture Texture Color No Abnormalities Noted: No No Abnormalities Noted: No Moisture No Abnormalities Noted: No Electronic Signature(s) Signed: 03/08/2017 4:59:08 PM By: Hansel Starling ([id number]) Entered By: Montey Hora on 03/08/2017 13:56:45 [name] [name] ([id number]) -------------------------------------------------------------------------------- [place] Details Patient Name: [name] [name] Date of Service: 03/08/2017 1:30 PM Medical Record Number: [id number] Patient Account Number: [id number] Date of Birth/Sex: Nov 21, 1928 (83 y.o. Male) Treating RN: Montey Hora Primary Care Mahitha Hickling: [name] Other Clinician: Referring Paulette Lynch: [name] Treating Dannel Rafter/Extender: Tito Dine in Treatment: 8 Vital Signs Time Taken: 13:43 Temperature (F): 97.6 Height (in): 71 Pulse (bpm): 88 Weight (lbs): 143 Respiratory Rate (breaths/min): 20 Body Mass Index (BMI): 19.9 Blood Pressure (mmHg): 102/64 Reference Range: 80 - 120 mg / dl Electronic Signature(s) Signed: 03/08/2017 4:59:08 PM By: Montey Hora Entered By: Montey Hora on 03/08/2017 13:43:35

## 2017-03-09 NOTE — Progress Notes (Signed)
TERRYN, REDNER ([id number]) Visit Report for 03/08/2017 HPI Details Patient Name: [name] [name], [name] [name] Date of Service: 03/08/2017 1:30 PM Medical Record Number: [id number] Patient Account Number: [id number] Date of Birth/Sex: 08/26/1929 (83 y.o. Male) Treating RN: Montey Hora Primary Care Provider: [name] Other Clinician: Referring Provider: [name] Treating Provider/Extender: Tito Dine in Treatment: 8 History of Present Illness Location: left trocanter Quality: denies pain Duration: the ulcer was noticed two weeks ago Context: ulcer is secondary to pressure Modifying Factors: unrelieved pressure is an exacerbating factor HPI Description: 01/05/17- the patient is here for initial violation of a left trochanter pressure ulcer that was originally noticed by his wife a few weeks ago. He states that he traditionally sleeps on his left side. He has lost between 20 and 30 pounds of the past 6 months. He cannot articulate a specific reason for this he states that it's not a loss of appetite. He denies getting fatigued with eating, despite having pulmonary fibrosis, COPD and being exertionally oxygen dependent. They have been applying neosporin. Lab work obtained in January revealed an albumin level 3.7. He is a non-diabetic, former smoker (quitting > 40 yrs ago) 01/12/17; left trochanter ulcer. smaller using iodosorb which was started last week. He is hypoxic. does not keep O2 on reliable at least here. When this happen P02 drops in the 70s 01/19/17; left trochanteric ulcer using Iodosorb about half the size of last week. O2 sat is 96% on 2 L respiratory status seems more stable 01/26/17 wouind measures slightly larger this week. non viable surface. Using iodosorb 02/02/17; again measurements unchanged this week. Non-viable surface. We had been using Iodosorb with some improvement I switched off the Prisma last week but I can't really understand why that was done. 02/09/17;  dimensions are smaller left greater trochanter. Still in nonviable surface. We've been using Iodosorb. Fortunately the stage I pressure area over the right hip has resolved. 02/15/17; patient arrived in the clinic with low blood pressure. He is on metoprolol and lisinopril but the doses are unclear. Also takes torsemide 20 twice a day. He is on these largely for cardiac issues not particularly hypertension and in discussion with his wife this marginal blood pressure issue has been something that is been noted in the past. 02/22/17; 0.5x0.7x0.1 slightly larger than last week. 03/02/17; 0.2 x 0.3 x 0.1 much smaller than last week and much more superficial 03/08/17; wound is totally epithelialized and healed. Electronic Signature(s) Signed: 03/08/2017 5:27:39 PM By: Linton Ham MD Entered By: Linton Ham on 03/08/2017 14:02:03 [name] [name] ([id number]) -------------------------------------------------------------------------------- Physical Exam Details Patient Name: [name] [name] Date of Service: 03/08/2017 1:30 PM Medical Record Number: [id number] Patient Account Number: [id number] Date of Birth/Sex: July 03, 1929 (83 y.o. Male) Treating RN: Montey Hora Primary Care Provider: [name] Other Clinician: Referring Provider: [name] Treating Provider/Extender: Ricard Dillon Weeks in Treatment: 8 Constitutional Sitting or standing Blood Pressure is within target range for patient.. Pulse regular and within target range for patient.Marland Kitchen Respirations regular, non-labored and within target range.. Temperature is normal and within the target range for the patient.Marland Kitchen appears in no distress. Somewhat less responsive than I'm used to seeing. Notes Wound exam; the wound is totally epithelialized. There is no surrounding erythema. Electronic Signature(s) Signed: 03/08/2017 5:27:39 PM By: Linton Ham MD Entered By: Linton Ham on 03/08/2017 14:02:56 [name] [name]  ([id number]) -------------------------------------------------------------------------------- Physician Orders Details Patient Name: [name] [name] Date of Service: 03/08/2017 1:30 PM Medical Record Number: [id number] Patient Account  Number: [id number] Date of Birth/Sex: 07-May-1929 (83 y.o. Male) Treating RN: Montey Hora Primary Care Provider: [name] Other Clinician: Referring Provider: [name] Treating Provider/Extender: Tito Dine in Treatment: 8 Verbal / Phone Orders: No Diagnosis Coding Discharge From [facility] o Discharge from [place] Signature(s) Signed: 03/08/2017 4:59:08 PM By: Montey Hora Signed: 03/08/2017 5:27:39 PM By: Linton Ham MD Entered By: Montey Hora on 03/08/2017 13:57:25 [name] [name] ([id number]) -------------------------------------------------------------------------------- Problem List Details Patient Name: [name] [name] Date of Service: 03/08/2017 1:30 PM Medical Record Number: [id number] Patient Account Number: [id number] Date of Birth/Sex: 11-21-1928 (83 y.o. Male) Treating RN: Montey Hora Primary Care Provider: [name] Other Clinician: Referring Provider: [name] Treating Provider/Extender: Ricard Dillon Weeks in Treatment: 8 Active Problems ICD-10 Encounter Code Description Active Date Diagnosis L89.220 Pressure ulcer of left hip, unstageable 01/05/2017 Yes R63.4 Abnormal weight loss 01/05/2017 Yes J84.10 Pulmonary fibrosis, unspecified 01/05/2017 Yes Inactive Problems Resolved Problems Electronic Signature(s) Signed: 03/08/2017 5:27:39 PM By: Linton Ham MD Entered By: Linton Ham on 03/08/2017 14:00:59 [name] [name] ([id number]) -------------------------------------------------------------------------------- Progress Note Details Patient Name: [name] [name] Date of Service: 03/08/2017 1:30 PM Medical Record Number: [id number] Patient Account  Number: [id number] Date of Birth/Sex: 11/21/1928 (83 y.o. Male) Treating RN: Montey Hora Primary Care Provider: [name] Other Clinician: Referring Provider: [name] Treating Provider/Extender: Tito Dine in Treatment: 8 Subjective History of Present Illness (HPI) The following HPI elements were documented for the patient's wound: Location: left trocanter Quality: denies pain Duration: the ulcer was noticed two weeks ago Context: ulcer is secondary to pressure Modifying Factors: unrelieved pressure is an exacerbating factor 01/05/17- the patient is here for initial violation of a left trochanter pressure ulcer that was originally noticed by his wife a few weeks ago. He states that he traditionally sleeps on his left side. He has lost between 20 and 30 pounds of the past 6 months. He cannot articulate a specific reason for this he states that it's not a loss of appetite. He denies getting fatigued with eating, despite having pulmonary fibrosis, COPD and being exertionally oxygen dependent. They have been applying neosporin. Lab work obtained in January revealed an albumin level 3.7. He is a non-diabetic, former smoker (quitting > 40 yrs ago) 01/12/17; left trochanter ulcer. smaller using iodosorb which was started last week. He is hypoxic. does not keep O2 on reliable at least here. When this happen P02 drops in the 70s 01/19/17; left trochanteric ulcer using Iodosorb about half the size of last week. O2 sat is 96% on 2 L respiratory status seems more stable 01/26/17 wouind measures slightly larger this week. non viable surface. Using iodosorb 02/02/17; again measurements unchanged this week. Non-viable surface. We had been using Iodosorb with some improvement I switched off the Prisma last week but I can't really understand why that was done. 02/09/17; dimensions are smaller left greater trochanter. Still in nonviable surface. We've been using Iodosorb. Fortunately the  stage I pressure area over the right hip has resolved. 02/15/17; patient arrived in the clinic with low blood pressure. He is on metoprolol and lisinopril but the doses are unclear. Also takes torsemide 20 twice a day. He is on these largely for cardiac issues not particularly hypertension and in discussion with his wife this marginal blood pressure issue has been something that is been noted in the past. 02/22/17; 0.5x0.7x0.1 slightly larger than last week. 03/02/17; 0.2 x 0.3 x 0.1 much smaller than last week  and much more superficial 03/08/17; wound is totally epithelialized and healed. Objective CANDIDO, FLOTT ([id number]) Constitutional Sitting or standing Blood Pressure is within target range for patient.. Pulse regular and within target range for patient.Marland Kitchen Respirations regular, non-labored and within target range.. Temperature is normal and within the target range for the patient.Marland Kitchen appears in no distress. Somewhat less responsive than I'm used to seeing. Vitals Time Taken: 1:43 PM, Height: 71 in, Weight: 143 lbs, BMI: 19.9, Temperature: 97.6 F, Pulse: 88 bpm, Respiratory Rate: 20 breaths/min, Blood Pressure: 102/64 mmHg. General Notes: Wound exam; the wound is totally epithelialized. There is no surrounding erythema. Integumentary (Hair, Skin) Wound #1 status is Healed - Epithelialized. Original cause of wound was Pressure Injury. The wound is located on the Left Trochanter. The wound measures 0cm length x 0cm width x 0cm depth; 0cm^2 area and 0cm^3 volume. Assessment Active Problems ICD-10 L89.220 - Pressure ulcer of left hip, unstageable R63.4 - Abnormal weight loss J84.10 - Pulmonary fibrosis, unspecified Plan [facility]: Discharge from [place] #1 I think the patient can be discharged from the wound care center #2 I recommended border foam covering to protect this area for 2-3 weeks and careful attention to offloading this area especially at  night. CLEBURNE, SAVINI ([id number]) Electronic Signature(s) Signed: 03/08/2017 5:27:39 PM By: Linton Ham MD Entered By: Linton Ham on 03/08/2017 14:03:30 [name] [name] ([id number]) -------------------------------------------------------------------------------- [place] Details Patient Name: [name] [name] Date of Service: 03/08/2017 Medical Record Number: [id number] Patient Account Number: [id number] Date of Birth/Sex: 07-Jun-1929 (83 y.o. Male) Treating RN: Montey Hora Primary Care Provider: [name] Other Clinician: Referring Provider: [name] Treating Provider/Extender: Tito Dine in Treatment: 8 Diagnosis Coding ICD-10 Codes Code Description [phone] Pressure ulcer of left hip, unstageable R63.4 Abnormal weight loss J84.10 Pulmonary fibrosis, unspecified Facility Procedures CPT4 Code: [id number] Description: [id number] - WOUND CARE VISIT-LEV 2 EST PT Modifier: Quantity: 1 Physician Procedures CPT4 Code: [id number] Description: 14830 - WC PHYS LEVEL 2 - EST PT ICD-10 Description Diagnosis L89.220 Pressure ulcer of left hip, unstageable Modifier: Quantity: 1 Electronic Signature(s) Signed: 03/08/2017 2:37:35 PM By: Montey Hora Signed: 03/08/2017 5:27:39 PM By: Linton Ham MD Entered By: Montey Hora on 03/08/2017 14:37:35

## 2017-05-03 ENCOUNTER — Other Ambulatory Visit: Payer: Self-pay | Admitting: Family Medicine

## 2017-05-03 DIAGNOSIS — W19XXXA Unspecified fall, initial encounter: Secondary | ICD-10-CM

## 2017-05-03 DIAGNOSIS — M79602 Pain in left arm: Secondary | ICD-10-CM

## 2017-05-04 ENCOUNTER — Ambulatory Visit
Admission: RE | Admit: 2017-05-04 | Discharge: 2017-05-04 | Disposition: A | Discharge status: Home / Self Care | Payer: Medicare Other | Source: Ambulatory Visit | Attending: Family Medicine | Admitting: Family Medicine

## 2017-05-04 DIAGNOSIS — S42132A Displaced fracture of coracoid process, left shoulder, initial encounter for closed fracture: Secondary | ICD-10-CM | POA: Diagnosis not present

## 2017-05-04 DIAGNOSIS — W19XXXA Unspecified fall, initial encounter: Secondary | ICD-10-CM | POA: Diagnosis not present

## 2017-05-04 DIAGNOSIS — M25412 Effusion, left shoulder: Secondary | ICD-10-CM | POA: Diagnosis not present

## 2017-05-04 DIAGNOSIS — M79602 Pain in left arm: Secondary | ICD-10-CM | POA: Diagnosis present

## 2017-08-08 ENCOUNTER — Other Ambulatory Visit: Payer: Self-pay

## 2017-08-08 ENCOUNTER — Inpatient Hospital Stay
Admission: EM | Admit: 2017-08-08 | Discharge: 2017-08-08 | DRG: 388 | Disposition: A | Discharge status: Other Acute Inpatient Hospital | Payer: Medicare Other | Attending: Internal Medicine | Admitting: Internal Medicine

## 2017-08-08 ENCOUNTER — Emergency Department: Payer: Medicare Other

## 2017-08-08 ENCOUNTER — Inpatient Hospital Stay: Payer: Medicare Other

## 2017-08-08 DIAGNOSIS — R791 Abnormal coagulation profile: Secondary | ICD-10-CM | POA: Diagnosis present

## 2017-08-08 DIAGNOSIS — K56609 Unspecified intestinal obstruction, unspecified as to partial versus complete obstruction: Secondary | ICD-10-CM | POA: Diagnosis present

## 2017-08-08 DIAGNOSIS — I4891 Unspecified atrial fibrillation: Secondary | ICD-10-CM | POA: Diagnosis present

## 2017-08-08 DIAGNOSIS — R748 Abnormal levels of other serum enzymes: Secondary | ICD-10-CM | POA: Diagnosis present

## 2017-08-08 DIAGNOSIS — I509 Heart failure, unspecified: Secondary | ICD-10-CM | POA: Diagnosis present

## 2017-08-08 DIAGNOSIS — Z8249 Family history of ischemic heart disease and other diseases of the circulatory system: Secondary | ICD-10-CM

## 2017-08-08 DIAGNOSIS — Z87891 Personal history of nicotine dependence: Secondary | ICD-10-CM | POA: Diagnosis not present

## 2017-08-08 DIAGNOSIS — Z85828 Personal history of other malignant neoplasm of skin: Secondary | ICD-10-CM | POA: Diagnosis not present

## 2017-08-08 DIAGNOSIS — Z7901 Long term (current) use of anticoagulants: Secondary | ICD-10-CM | POA: Diagnosis not present

## 2017-08-08 DIAGNOSIS — J8410 Pulmonary fibrosis, unspecified: Secondary | ICD-10-CM | POA: Diagnosis present

## 2017-08-08 DIAGNOSIS — Z96652 Presence of left artificial knee joint: Secondary | ICD-10-CM | POA: Diagnosis present

## 2017-08-08 DIAGNOSIS — J449 Chronic obstructive pulmonary disease, unspecified: Secondary | ICD-10-CM | POA: Diagnosis present

## 2017-08-08 DIAGNOSIS — I130 Hypertensive heart and chronic kidney disease with heart failure and stage 1 through stage 4 chronic kidney disease, or unspecified chronic kidney disease: Secondary | ICD-10-CM | POA: Diagnosis present

## 2017-08-08 DIAGNOSIS — I272 Pulmonary hypertension, unspecified: Secondary | ICD-10-CM | POA: Diagnosis present

## 2017-08-08 DIAGNOSIS — N183 Chronic kidney disease, stage 3 (moderate): Secondary | ICD-10-CM | POA: Diagnosis present

## 2017-08-08 DIAGNOSIS — I252 Old myocardial infarction: Secondary | ICD-10-CM

## 2017-08-08 DIAGNOSIS — Z7951 Long term (current) use of inhaled steroids: Secondary | ICD-10-CM

## 2017-08-08 DIAGNOSIS — Z96641 Presence of right artificial hip joint: Secondary | ICD-10-CM | POA: Diagnosis present

## 2017-08-08 DIAGNOSIS — I2510 Atherosclerotic heart disease of native coronary artery without angina pectoris: Secondary | ICD-10-CM | POA: Diagnosis present

## 2017-08-08 DIAGNOSIS — R000 Tachycardia, unspecified: Secondary | ICD-10-CM | POA: Diagnosis present

## 2017-08-08 DIAGNOSIS — M109 Gout, unspecified: Secondary | ICD-10-CM | POA: Diagnosis present

## 2017-08-08 DIAGNOSIS — G9341 Metabolic encephalopathy: Secondary | ICD-10-CM | POA: Diagnosis present

## 2017-08-08 DIAGNOSIS — N179 Acute kidney failure, unspecified: Secondary | ICD-10-CM | POA: Diagnosis present

## 2017-08-08 DIAGNOSIS — Z66 Do not resuscitate: Secondary | ICD-10-CM | POA: Diagnosis present

## 2017-08-08 DIAGNOSIS — Z9981 Dependence on supplemental oxygen: Secondary | ICD-10-CM

## 2017-08-08 DIAGNOSIS — E785 Hyperlipidemia, unspecified: Secondary | ICD-10-CM | POA: Diagnosis present

## 2017-08-08 DIAGNOSIS — Z79899 Other long term (current) drug therapy: Secondary | ICD-10-CM

## 2017-08-08 LAB — COMPREHENSIVE METABOLIC PANEL
ALT: 19 U/L (ref 17–63)
AST: 35 U/L (ref 15–41)
Albumin: 3.9 g/dL (ref 3.5–5.0)
Alkaline Phosphatase: 153 U/L — ABNORMAL HIGH (ref 38–126)
Anion gap: 17 — ABNORMAL HIGH (ref 5–15)
BUN: 86 mg/dL — AB (ref 6–20)
CHLORIDE: 95 mmol/L — AB (ref 101–111)
CO2: 23 mmol/L (ref 22–32)
Calcium: 9.6 mg/dL (ref 8.9–10.3)
Creatinine, Ser: 3.01 mg/dL — ABNORMAL HIGH (ref 0.61–1.24)
GFR, EST AFRICAN AMERICAN: 20 mL/min — AB (ref >60)
GFR, EST NON AFRICAN AMERICAN: 17 mL/min — AB (ref >60)
Glucose, Bld: 213 mg/dL — ABNORMAL HIGH (ref 65–99)
POTASSIUM: 3.7 mmol/L (ref 3.5–5.1)
Sodium: 135 mmol/L (ref 135–145)
Total Bilirubin: 1.1 mg/dL (ref 0.3–1.2)
Total Protein: 7.6 g/dL (ref 6.5–8.1)

## 2017-08-08 LAB — LIPASE, BLOOD: LIPASE: 18 U/L (ref 11–51)

## 2017-08-08 LAB — CBC
HEMATOCRIT: 40.1 % (ref 40.0–52.0)
Hemoglobin: 13.1 g/dL (ref 13.0–18.0)
MCH: 34.5 pg — ABNORMAL HIGH (ref 26.0–34.0)
MCHC: 32.6 g/dL (ref 32.0–36.0)
MCV: 105.8 fL — ABNORMAL HIGH (ref 80.0–100.0)
PLATELETS: 250 10*3/uL (ref 150–440)
RBC: 3.79 MIL/uL — ABNORMAL LOW (ref 4.40–5.90)
RDW: 15.7 % — AB (ref 11.5–14.5)
WBC: 5.9 10*3/uL (ref 3.8–10.6)

## 2017-08-08 LAB — TROPONIN I: TROPONIN I: 0.05 ng/mL — AB (ref <0.03)

## 2017-08-08 LAB — PROTIME-INR
INR: 4.4 — AB
Prothrombin Time: 41.7 seconds — ABNORMAL HIGH (ref 11.4–15.2)

## 2017-08-08 MED ORDER — VITAMIN K1 10 MG/ML IJ SOLN
5.0000 mg | Freq: Once | INTRAVENOUS | Status: AC
  Administered 2017-08-08: 5 mg via INTRAVENOUS
  Filled 2017-08-08: qty 0.5

## 2017-08-08 MED ORDER — DILTIAZEM HCL 25 MG/5ML IV SOLN
10.0000 mg | Freq: Once | INTRAVENOUS | Status: AC
  Administered 2017-08-08: 10 mg via INTRAVENOUS
  Filled 2017-08-08: qty 5

## 2017-08-08 MED ORDER — SODIUM CHLORIDE 0.9 % IV BOLUS (SEPSIS)
500.0000 mL | Freq: Once | INTRAVENOUS | Status: AC
  Administered 2017-08-08: 500 mL via INTRAVENOUS

## 2017-08-08 MED ORDER — DILTIAZEM HCL 100 MG IV SOLR
5.0000 mg/h | Freq: Once | INTRAVENOUS | Status: AC
  Administered 2017-08-08: 5 mg/h via INTRAVENOUS
  Filled 2017-08-08: qty 100

## 2017-08-08 NOTE — ED Notes (Signed)
Report called to Belva Chimes rn ccu nurse at [facility]

## 2017-08-08 NOTE — Consult Note (Signed)
[name] Antigua, MD [address], [place], [place], [place], [address] [address], [place], [place], [place], [address] Phone: [phone]  Fax: [phone]  Consultation  Referring Provider:     Dr. Bridgett Larsson Primary Care Physician:  Rusty Aus, MD Primary Gastroenterologist:  Dr. Bonna Gains         Reason for Consultation:     Colon dilation  Date of Admission:  08/08/2017 Date of Consultation:  08/08/2017         HPI:   [name] [name] is a 83 y.o. male presents with abdominal pain of 1-2 day duration. It is located in the b/l upper quadrants, 5/10 in severity, dull in quality. Pt. Has had decreased appetite for a week and over the last few days had anuria as per the daughter. CT showed colonic distension to 11 cm with air fluid levels of cecum upto descending colon with normal caliber descending colon more distally. CT was without contrast and did not report a mass. He has had no fever/chills and no signs of acute abdomen. His trop is mildly elevated, supratherapeutic INR on coumadin and he has multiple comorbidities listed below and follows with these at [facility].   Past Medical History:  Diagnosis Date  . Atrial fibrillation ([place])   . Basal cell carcinoma   . CHF (congestive heart failure) ([place])   . Chronic kidney disease   . COPD (chronic obstructive pulmonary disease) ([place])   . Coronary artery disease   . Gout   . Hyperlipidemia   . Hypertension   . Myocardial infarction ([place])   . Pulmonary fibrosis ([place])   . Pulmonary HTN ([place])     Past Surgical History:  Procedure Laterality Date  . CARDIAC CATHETERIZATION    . CARDIOVERSION    . CAROTID ENDARTERECTOMY Left   . HERNIA REPAIR    . REPLACEMENT TOTAL KNEE Left   . TOTAL HIP ARTHROPLASTY Right     Prior to Admission medications   Not on File    Family History  Problem Relation Age of Onset  . Heart attack Father   . Heart attack Brother      Social History   Tobacco Use  . Smoking status: Former  Smoker    Packs/day: 0.00    Years: 20.00    Pack years: 0.00    Last attempt to quit: 01/31/1971    Years since quitting: 46.5  . Smokeless tobacco: Never Used  . Tobacco comment: quit 4 yrs ago  Substance Use Topics  . Alcohol use: Yes    Alcohol/week: 0.0 oz    Comment: occ  . Drug use: No    Allergies as of 08/08/2017  . (No Known Allergies)    Review of Systems:    All systems reviewed and negative except where noted in HPI.   Physical Exam:  Vital signs in last 24 hours: Temp:  [98.2 F (36.8 C)] 98.2 F (36.8 C) (11/05 1416) Pulse Rate:  [36-123] 98 (11/05 1800) Resp:  [16-24] 18 (11/05 1800) BP: (103-154)/(65-89) 130/89 (11/05 1800) SpO2:  [96 %-100 %] 98 % (11/05 1800) Weight:  [65.8 kg (145 lb)] 65.8 kg (145 lb) (11/05 1417)   General:   Pleasant, cooperative in NAD Head:  Normocephalic and atraumatic. Eyes:   No icterus.   Conjunctiva pink. PERRLA. Ears:  Normal auditory acuity. Neck:  Supple; no masses or thyroidomegaly Lungs: Respirations even and unlabored. Lungs clear to auscultation bilaterally.   No wheezes, crackles, or rhonchi.  Heart:  Regular rate and rhythm;  Without murmur, clicks, rubs or gallops Abdomen:  Soft, mildly distended, nontender. Decreased bowel sounds. No appreciable masses or hepatomegaly.  No rebound or guarding.  Rectal:  Not performed. Msk:  Symmetrical without gross deformities.   Extremities:  Without edema, cyanosis or clubbing. Neurologic:  Alert and oriented x3;  grossly normal neurologically. Skin:  Intact without significant lesions or rashes. Cervical Nodes:  No significant cervical adenopathy. Psych:  Alert and cooperative. Normal affect.  LAB RESULTS: Recent Labs    08/08/17 1422  WBC 5.9  HGB 13.1  HCT 40.1  PLT 250   BMET Recent Labs    08/08/17 1422  NA 135  K 3.7  CL 95*  CO2 23  GLUCOSE 213*  BUN 86*  CREATININE 3.01*  CALCIUM 9.6   LFT Recent Labs    08/08/17 1422  PROT 7.6  ALBUMIN 3.9   AST 35  ALT 19  ALKPHOS 153*  BILITOT 1.1   PT/INR Recent Labs    08/08/17 1422  LABPROT 41.7*  INR 4.40*    STUDIES: Ct Abdomen Pelvis Wo Contrast  Result Date: 08/08/2017 CLINICAL DATA:  Abdominal pain and distention with constipation for 2 days, prior LEFT side abdominal hernia repair, history atrial fibrillation, CHF, COPD, coronary artery disease post MI, chronic kidney disease, pulmonary fibrosis, pulmonary hypertension, former smoker EXAM: CT ABDOMEN AND PELVIS WITHOUT CONTRAST TECHNIQUE: Multidetector CT imaging of the abdomen and pelvis was performed following the standard protocol without IV contrast. Sagittal and coronal MPR images reconstructed from axial data set. Oral contrast was not administered. COMPARISON:  None FINDINGS: Lower chest: Emphysematous changes at lung bases with minimal basilar honeycomb formation in subpleural fibrosis. Hepatobiliary: Unremarkable Pancreas: Atrophic without mass Spleen: Normal appearance Adrenals/Urinary Tract: Minimal thickening of adrenal glands without discrete mass. Anterior RIGHT renal lesion likely a cyst 4.9 x 3.3 cm image 29. In kidneys, ureters, and mildly distended bladder otherwise normal appearance. Stomach/Bowel: Distended cecum with prominent air-fluid level, cecum measuring up to 11.0 cm diameter image 60. Ascending colon 9.9 cm diameter. Gaseous distention of ascending colon with dependent air-fluid level. Gas distention of transverse and descending colon with transition to normal caliber colon at the distal descending colon without definite mass lesion. Sigmoid diverticulosis. No definite signs of acute diverticulitis. Colon interposition between liver and diaphragm. Mild scattered distension of small bowel loops. Stomach decompressed. Small hiatal hernia. Vascular/Lymphatic: Atherosclerotic calcifications aorta common iliac arteries, femoral arteries, abdominal branch vessels. Aorta normal caliber. Coronary retro calcifications  noted. No definite adenopathy. Reproductive: Mild prostatic enlargement gland 5.5 x 4.4 cm. Other: Minimal free fluid dependently in pelvis. No definite free intraperitoneal air. No hernia Musculoskeletal: Osseous demineralization with scattered degenerative disc disease changes of the thoracolumbar spine. Facet degenerative changes. Prior RIGHT hip replacement. Anterolisthesis at L4-L5 likely due to degenerative disease. Mild retrolisthesis at L1-L2. Advanced degenerative changes of the LEFT hip joint. IMPRESSION: Distention of the colon by gas and fluid up to 11.0 cm transverse diameter at cecum. Transition of colon from dilated to nondilated at the distal descending colon without obvious mass. Sigmoid diverticulosis without definite evidence of diverticulitis. RIGHT renal cyst. Coronary artery disease. COPD changes at lung bases with suspected mild pulmonary fibrosis. Aortic Atherosclerosis (ICD10-I70.0) and Emphysema (ICD10-J43.9). Electronically Signed   By: Lavonia Dana M.D.   On: 08/08/2017 16:13      Impression / Plan:   RODEL GLASPY is a 83 y.o. y/o male with colon distension of 11cm with  normal sized descending colon with no mass reported on CT scan which was without contrast and multiple comborbidities  Last colonoscopy was over 20 yrs ago Etiology could be due to malignancy or stricture  Any procedure is high risk given his supratherapeutic INR, elevated Trop and Pulm Fibrosis/Pulm HTN and other comorbities I discussed performing a Flex-Sig for decompression with anesthesia staff. Given his clinical findings and history and they are not comfortable sedating the patient tonight at [facility] and recommend transfering the patient  I informed the ER staff of the same and they will work on transferring the patient I discussed this in detail with the family as well and they are agreeable with the decision as well and feel that with all his providers being at [facility], they would prefer to go there as  well.  If patient develops signs of acute abdomen please page surgery immediately  Thank you for involving me in the care of this patient.      LOS: 0 days   Virgel Manifold, MD  08/08/2017, 6:42 PM

## 2017-08-08 NOTE — H&P (Signed)
[facility] [place] NAME: [name] [name]    MR#:  [id number]  DATE OF BIRTH:  Dec 30, 1928  DATE OF ADMISSION:  08/08/2017  PRIMARY CARE PHYSICIAN: Rusty Aus, MD   REQUESTING/REFERRING PHYSICIAN: Merlyn Lot, MD  CHIEF COMPLAINT:   Chief Complaint  Patient presents with  . Abdominal Pain  . Altered Mental Status   Abdominal pain and distention for 5 days  Confusion 2-3 days HISTORY OF PRESENT ILLNESS:  [name] [name]  is a 83 y.o. male with a known history of A. fib, CHF, CKD, COPD, CAD, hypertension, hyperlipidemia, pulmonary fibrosis and pulmonary hypertension.  The patient was sent to ED from home due to above chief complaints.  The patient is confused and does not good historian.  According to his daughter, the patient has had abdominal pain and distention for the past 5 days.  He has no bowel movement for 5 days.  He has poor oral intake and less urine, even no urine today.  He also is found confused for 2-3 days.  The patient uses oxygen at home as needed.  The patient is found A. fib with RVR at 130s.  Creatinine is elevated at 3.0 and INR is 4.4.  CAT scan of the abdomen showed colonic obstruction.  ED physician contact with GI physician and surgeon Dr. Burt Knack.  PAST MEDICAL HISTORY:   Past Medical History:  Diagnosis Date  . Atrial fibrillation ([place])   . Basal cell carcinoma   . CHF (congestive heart failure) ([place])   . Chronic kidney disease   . COPD (chronic obstructive pulmonary disease) ([place])   . Coronary artery disease   . Gout   . Hyperlipidemia   . Hypertension   . Myocardial infarction ([place])   . Pulmonary fibrosis ([place])   . Pulmonary HTN ([place])     PAST SURGICAL HISTORY:   Past Surgical History:  Procedure Laterality Date  . CARDIAC CATHETERIZATION    . CARDIOVERSION    . CAROTID ENDARTERECTOMY Left   . HERNIA REPAIR    . REPLACEMENT TOTAL KNEE Left   . TOTAL HIP ARTHROPLASTY Right     SOCIAL  HISTORY:   Social History   Tobacco Use  . Smoking status: Former Smoker    Packs/day: 0.00    Years: 20.00    Pack years: 0.00    Last attempt to quit: 01/31/1971    Years since quitting: 46.5  . Smokeless tobacco: Never Used  . Tobacco comment: quit 4 yrs ago  Substance Use Topics  . Alcohol use: Yes    Alcohol/week: 0.0 oz    Comment: occ    FAMILY HISTORY:   Family History  Problem Relation Age of Onset  . Heart attack Father   . Heart attack Brother     DRUG ALLERGIES:  No Known Allergies  REVIEW OF SYSTEMS:   Review of Systems  Unable to perform ROS: Mental status change    MEDICATIONS AT HOME:   Prior to Admission medications   Medication Sig Start Date End Date Taking? Authorizing Provider  albuterol (PROVENTIL HFA;VENTOLIN HFA) 108 (90 BASE) MCG/ACT inhaler Inhale 2 puffs into the lungs every 4 (four) hours as needed for wheezing or shortness of breath.   Yes [provider]  allopurinol (ZYLOPRIM) 300 MG tablet Take 300 mg daily by mouth.    Yes [provider]  budesonide-formoterol (SYMBICORT) 80-4.5 MCG/ACT inhaler Inhale 2 puffs 2 (two) times daily into the lungs.  Yes [provider]  DULoxetine (CYMBALTA) 20 MG capsule Take 20 mg daily by mouth.   Yes [provider]  ferrous sulfate 324 (65 FE) MG TBEC Take 1 tablet by mouth daily.   Yes [provider]  ibuprofen (ADVIL,MOTRIN) 200 MG tablet Take 200-400 mg every 6 (six) hours as needed by mouth.   Yes [provider]  metoprolol succinate (TOPROL-XL) 25 MG 24 hr tablet Take 25 mg daily by mouth.   Yes [provider]  pantoprazole (PROTONIX) 40 MG tablet Take 40 mg by mouth daily.   Yes [provider]  potassium chloride SA (K-DUR,KLOR-CON) 20 MEQ tablet Take 20 mEq 2 (two) times daily by mouth. Take 1 tablet by mouth twice daily - may take 1 additional tablet with torsemide if needed for swelling   Yes [provider]    simvastatin (ZOCOR) 40 MG tablet Take 40 mg daily by mouth.    Yes [provider]  tiotropium (SPIRIVA) 18 MCG inhalation capsule Place 18 mcg daily into inhaler and inhale.   Yes [provider]  torsemide (DEMADEX) 20 MG tablet Take 20 mg 2 (two) times daily by mouth. Take 1 tablet by mouth twice daily - may take 1 additional tablet with potassium if needed for swelling   Yes [provider]  Treprostinil (TYVASO) 0.6 MG/ML SOLN Inhale 18 mcg into the lungs 4 (four) times daily.   Yes [provider]  warfarin (COUMADIN) 2 MG tablet Take 2 mg 2 (two) times daily by mouth.    Yes [provider]  lisinopril (PRINIVIL,ZESTRIL) 2.5 MG tablet Take 1 tablet daily by mouth. 07/06/17   [provider]  oxyCODONE (ROXICODONE) 5 MG immediate release tablet Take 1 tablet (5 mg total) by mouth every 6 (six) hours as needed for moderate pain. Patient not taking: Reported on 08/08/2017 08/22/16 08/22/17  Johnn Hai, PA-C  oxyCODONE-acetaminophen (ROXICET) 5-325 MG tablet Take 1 tablet by mouth every 6 (six) hours as needed for severe pain. Patient not taking: Reported on 08/08/2017 05/15/16   Cuthriell, Charline Bills, PA-C      VITAL SIGNS:  Blood pressure 119/65, pulse (!) 116, temperature 98.2 F (36.8 C), resp. rate 18, height _0  (1.803 m), weight 145 lb (65.8 kg), SpO2 100 %.  PHYSICAL EXAMINATION:  Physical Exam  GENERAL:  83 y.o.-year-old patient lying in the bed with no acute distress.  EYES: Pupils equal, round, reactive to light and accommodation. No scleral icterus. Extraocular muscles intact.  HEENT: Head atraumatic, normocephalic. Oropharynx and nasopharynx clear.  Dry oral mucosa. NECK:  Supple, no jugular venous distention. No thyroid enlargement, no tenderness.  LUNGS: Normal breath sounds bilaterally, no wheezing, rales,rhonchi or crepitation. No use of accessory muscles of respiration.  CARDIOVASCULAR: S1, S2 normal. No  murmurs, rubs, or gallops.  ABDOMEN: Soft, nontender, nondistended. Bowel sounds present. No organomegaly or mass.  EXTREMITIES: No pedal edema, cyanosis, or clubbing.  NEUROLOGIC: Cranial nerves II through XII are intact. Muscle strength 4/5 in all extremities. Sensation intact. Gait not checked.  PSYCHIATRIC: The patient is confused, knows his name and location but not time. SKIN: No obvious rash, lesion, or ulcer.  Has multiple bruises on both arms and legs.  LABORATORY PANEL:   CBC Recent Labs  Lab 08/08/17 1422  WBC 5.9  HGB 13.1  HCT 40.1  PLT 250   ------------------------------------------------------------------------------------------------------------------  Chemistries  Recent Labs  Lab 08/08/17 1422  NA 135  K 3.7  CL  95*  CO2 23  GLUCOSE 213*  BUN 86*  CREATININE 3.01*  CALCIUM 9.6  AST 35  ALT 19  ALKPHOS 153*  BILITOT 1.1   ------------------------------------------------------------------------------------------------------------------  Cardiac Enzymes Recent Labs  Lab 08/08/17 1422  TROPONINI 0.05*   ------------------------------------------------------------------------------------------------------------------  RADIOLOGY:  Ct Abdomen Pelvis Wo Contrast  Result Date: 08/08/2017 CLINICAL DATA:  Abdominal pain and distention with constipation for 2 days, prior LEFT side abdominal hernia repair, history atrial fibrillation, CHF, COPD, coronary artery disease post MI, chronic kidney disease, pulmonary fibrosis, pulmonary hypertension, former smoker EXAM: CT ABDOMEN AND PELVIS WITHOUT CONTRAST TECHNIQUE: Multidetector CT imaging of the abdomen and pelvis was performed following the standard protocol without IV contrast. Sagittal and coronal MPR images reconstructed from axial data set. Oral contrast was not administered. COMPARISON:  None FINDINGS: Lower chest: Emphysematous changes at lung bases with minimal basilar honeycomb formation in subpleural  fibrosis. Hepatobiliary: Unremarkable Pancreas: Atrophic without mass Spleen: Normal appearance Adrenals/Urinary Tract: Minimal thickening of adrenal glands without discrete mass. Anterior RIGHT renal lesion likely a cyst 4.9 x 3.3 cm image 29. In kidneys, ureters, and mildly distended bladder otherwise normal appearance. Stomach/Bowel: Distended cecum with prominent air-fluid level, cecum measuring up to 11.0 cm diameter image 60. Ascending colon 9.9 cm diameter. Gaseous distention of ascending colon with dependent air-fluid level. Gas distention of transverse and descending colon with transition to normal caliber colon at the distal descending colon without definite mass lesion. Sigmoid diverticulosis. No definite signs of acute diverticulitis. Colon interposition between liver and diaphragm. Mild scattered distension of small bowel loops. Stomach decompressed. Small hiatal hernia. Vascular/Lymphatic: Atherosclerotic calcifications aorta common iliac arteries, femoral arteries, abdominal branch vessels. Aorta normal caliber. Coronary retro calcifications noted. No definite adenopathy. Reproductive: Mild prostatic enlargement gland 5.5 x 4.4 cm. Other: Minimal free fluid dependently in pelvis. No definite free intraperitoneal air. No hernia Musculoskeletal: Osseous demineralization with scattered degenerative disc disease changes of the thoracolumbar spine. Facet degenerative changes. Prior RIGHT hip replacement. Anterolisthesis at L4-L5 likely due to degenerative disease. Mild retrolisthesis at L1-L2. Advanced degenerative changes of the LEFT hip joint. IMPRESSION: Distention of the colon by gas and fluid up to 11.0 cm transverse diameter at cecum. Transition of colon from dilated to nondilated at the distal descending colon without obvious mass. Sigmoid diverticulosis without definite evidence of diverticulitis. RIGHT renal cyst. Coronary artery disease. COPD changes at lung bases with suspected mild pulmonary  fibrosis. Aortic Atherosclerosis (ICD10-I70.0) and Emphysema (ICD10-J43.9). Electronically Signed   By: Lavonia Dana M.D.   On: 08/08/2017 16:13      IMPRESSION AND PLAN:   A. fib with RVR. The patient will be admitted to medical floor with telemetry monitor. Continue Cardizem drip, hold Coumadin due to elevated INR. Cardiology consult.  Colonic obstruction N.p.o. with IV fluid support. Follow-up with GI and the general surgeon for further recommendation.  Acute renal failure on CKD stage III, possibly due to dehydration. Normal saline IV and follow-up BMP.  CAT scan of abdomen did not show obstruction.  Hold lisinopril, torsemide and ibuprofen.  Elevated troponin due to above.  Follow-up troponin level.  Acute metabolic encephalopathy due to above. Aspiration fall precautions.  Hypertension.  Hold hypertension medication due to low side of blood pressure.  History of chronic CHF. Unknown type. No echo. Stable.  Hold torsemide due to renal failure.  COPD.  Stable nebulizer as needed.  All the records are reviewed and case discussed with ED provider. Management plans discussed with the patient, his daughter  and they are in agreement.  CODE STATUS: DNR  TOTAL TIME TAKING CARE OF THIS PATIENT: 58 minutes.    Demetrios Loll M.D on 08/08/2017 at 5:30 PM  Between 7am to 6pm - Pager - [phone]  After 6pm go to [email or web address] - Proofreader  [facility]  Office  [phone]  CC: Primary care physician; Rusty Aus, MD   Note: This dictation was prepared with Dragon dictation along with smaller phrase technology. Any transcriptional errors that result from this process are unin

## 2017-08-08 NOTE — ED Notes (Signed)
Pt waiting on admission.  Family with pt

## 2017-08-08 NOTE — ED Notes (Signed)
Previous note entered in error by this Ed sec; Patient is being transferred to another facility for medical treatment

## 2017-08-08 NOTE — ED Notes (Signed)
[facility] here to take pt to [facility].  Pt alert.  Iv meds infusing.

## 2017-08-08 NOTE — ED Notes (Signed)
VOL/ Pending disposition

## 2017-08-08 NOTE — ED Notes (Signed)
Pt waiitng on transfer.  Pt alert.  afib on monitor  No ches t pain.  Family with pt.  Iv meds infusing.

## 2017-08-08 NOTE — ED Notes (Signed)
Resumed care from stephanie rn.  Pt alert. Family with pt   Iv in place.

## 2017-08-08 NOTE — ED Notes (Signed)
EMTALA checked for completion

## 2017-08-08 NOTE — Discharge Summary (Addendum)
[place] [facility] [place] NAME: [name] [name]    MR#:  [id number]  DATE OF BIRTH:  October 10, 1928  DATE OF ADMISSION:  08/08/2017   ADMITTING PHYSICIAN: Demetrios Loll, MD  DATE OF DISCHARGE: 08/08/2017 PRIMARY CARE PHYSICIAN: Rusty Aus, MD   ADMISSION DIAGNOSIS:  confusion, weakness DISCHARGE DIAGNOSIS:  Active Problems:   Atrial fibrillation with RVR ([place])  SECONDARY DIAGNOSIS:   Past Medical History:  Diagnosis Date  . Atrial fibrillation ([place])   . Basal cell carcinoma   . CHF (congestive heart failure) ([place])   . Chronic kidney disease   . COPD (chronic obstructive pulmonary disease) ([place])   . Coronary artery disease   . Gout   . Hyperlipidemia   . Hypertension   . Myocardial infarction ([place])   . Pulmonary fibrosis ([place])   . Pulmonary HTN [facility])    HOSPITAL COURSE:  Patient presented to ED with abdominal pain and distention for 5 days  Confusion 2-3 days.  He is found colonic obstruction and A. fib with RVR.  A. fib with RVR. The patient will be admitted to medical floor with telemetry monitor. Continue Cardizem drip, hold Coumadin due to elevated INR. Cardiology consult.  Colonic obstruction N.p.o. with IV fluid support. Follow-up with GI and the general surgeon for further recommendation.  Acute renal failure on CKD stage III, possibly due to dehydration. Normal saline IV and follow-up BMP.  CAT scan of abdomen did not show obstruction.  Hold lisinopril, torsemide and ibuprofen.  Elevated troponin due to above.  Follow-up troponin level.  Acute metabolic encephalopathy due to above. Aspiration fall precautions.  Hypertension.  Hold hypertension medication due to low side of blood pressure.  History of chronic CHF. Unknown type. No echo. Stable.  Hold torsemide due to renal failure.  COPD.  Stable nebulizer as needed.  Per Dr. Quentin Cornwall, the anesthesiologist has concerns about anesthesia for this patient.   Anesthesiologist to suggest transfer to tertiary hospital for further treatment. DISCHARGE CONDITIONS:  Guarded, transfer to tertiary facility per ED Dr. Quentin Cornwall. CONSULTS OBTAINED:   DRUG ALLERGIES:  No Known Allergies DISCHARGE MEDICATIONS:   Allergies as of 08/08/2017   No Known Allergies     Medication List    STOP taking these medications   albuterol 108 (90 Base) MCG/ACT inhaler Commonly known as:  PROVENTIL HFA;VENTOLIN HFA   allopurinol 300 MG tablet Commonly known as:  ZYLOPRIM   budesonide-formoterol 80-4.5 MCG/ACT inhaler Commonly known as:  SYMBICORT   DULoxetine 20 MG capsule Commonly known as:  CYMBALTA   ferrous sulfate 324 (65 Fe) MG Tbec   ibuprofen 200 MG tablet Commonly known as:  ADVIL,MOTRIN   lisinopril 2.5 MG tablet Commonly known as:  PRINIVIL,ZESTRIL   metoprolol succinate 25 MG 24 hr tablet Commonly known as:  TOPROL-XL   oxyCODONE 5 MG immediate release tablet Commonly known as:  ROXICODONE   oxyCODONE-acetaminophen 5-325 MG tablet Commonly known as:  ROXICET   pantoprazole 40 MG tablet Commonly known as:  PROTONIX   potassium chloride SA 20 MEQ tablet Commonly known as:  K-DUR,KLOR-CON   simvastatin 40 MG tablet Commonly known as:  ZOCOR   tiotropium 18 MCG inhalation capsule Commonly known as:  SPIRIVA   torsemide 20 MG tablet Commonly known as:  DEMADEX   Treprostinil 0.6 MG/ML Soln Commonly known as:  TYVASO   warfarin 2 MG tablet Commonly known as:  COUMADIN        DISCHARGE INSTRUCTIONS:  See AVS. If you experience worsening of your admission symptoms, develop shortness of breath, life threatening emergency, suicidal or homicidal thoughts you must seek medical attention immediately by calling 911 or calling your MD immediately  if symptoms less severe.  You Must read complete instructions/literature along with all the possible adverse reactions/side effects for all the Medicines you take and that have been  prescribed to you. Take any new Medicines after you have completely understood and accpet all the possible adverse reactions/side effects.   Please note  You were cared for by a hospitalist during your hospital stay. If you have any questions about your discharge medications or the care you received while you were in the hospital after you are discharged, you can call the unit and asked to speak with the hospitalist on call if the hospitalist that took care of you is not available. Once you are discharged, your primary care physician will handle any further medical issues. Please note that NO REFILLS for any discharge medications will be authorized once you are discharged, as it is imperative that you return to your primary care physician (or establish a relationship with a primary care physician if you do not have one) for your aftercare needs so that they can reassess your need for medications and monitor your lab values.    On the day of Discharge:  VITAL SIGNS:  Blood pressure 130/89, pulse 98, temperature 98.2 F (36.8 C), resp. rate 18, height _0  (1.803 m), weight 145 lb (65.8 kg), SpO2 98 %.  DATA REVIEW:   CBC Recent Labs  Lab 08/08/17 1422  WBC 5.9  HGB 13.1  HCT 40.1  PLT 250    Chemistries  Recent Labs  Lab 08/08/17 1422  NA 135  K 3.7  CL 95*  CO2 23  GLUCOSE 213*  BUN 86*  CREATININE 3.01*  CALCIUM 9.6  AST 35  ALT 19  ALKPHOS 153*  BILITOT 1.1     Microbiology Results  No results found for this or any previous visit.  RADIOLOGY:  Ct Abdomen Pelvis Wo Contrast  Result Date: 08/08/2017 CLINICAL DATA:  Abdominal pain and distention with constipation for 2 days, prior LEFT side abdominal hernia repair, history atrial fibrillation, CHF, COPD, coronary artery disease post MI, chronic kidney disease, pulmonary fibrosis, pulmonary hypertension, former smoker EXAM: CT ABDOMEN AND PELVIS WITHOUT CONTRAST TECHNIQUE: Multidetector CT imaging of the abdomen and  pelvis was performed following the standard protocol without IV contrast. Sagittal and coronal MPR images reconstructed from axial data set. Oral contrast was not administered. COMPARISON:  None FINDINGS: Lower chest: Emphysematous changes at lung bases with minimal basilar honeycomb formation in subpleural fibrosis. Hepatobiliary: Unremarkable Pancreas: Atrophic without mass Spleen: Normal appearance Adrenals/Urinary Tract: Minimal thickening of adrenal glands without discrete mass. Anterior RIGHT renal lesion likely a cyst 4.9 x 3.3 cm image 29. In kidneys, ureters, and mildly distended bladder otherwise normal appearance. Stomach/Bowel: Distended cecum with prominent air-fluid level, cecum measuring up to 11.0 cm diameter image 60. Ascending colon 9.9 cm diameter. Gaseous distention of ascending colon with dependent air-fluid level. Gas distention of transverse and descending colon with transition to normal caliber colon at the distal descending colon without definite mass lesion. Sigmoid diverticulosis. No definite signs of acute diverticulitis. Colon interposition between liver and diaphragm. Mild scattered distension of small bowel loops. Stomach decompressed. Small hiatal hernia. Vascular/Lymphatic: Atherosclerotic calcifications aorta common iliac arteries, femoral arteries, abdominal branch vessels. Aorta normal caliber. Coronary retro calcifications noted. No definite  adenopathy. Reproductive: Mild prostatic enlargement gland 5.5 x 4.4 cm. Other: Minimal free fluid dependently in pelvis. No definite free intraperitoneal air. No hernia Musculoskeletal: Osseous demineralization with scattered degenerative disc disease changes of the thoracolumbar spine. Facet degenerative changes. Prior RIGHT hip replacement. Anterolisthesis at L4-L5 likely due to degenerative disease. Mild retrolisthesis at L1-L2. Advanced degenerative changes of the LEFT hip joint. IMPRESSION: Distention of the colon by gas and fluid up to  11.0 cm transverse diameter at cecum. Transition of colon from dilated to nondilated at the distal descending colon without obvious mass. Sigmoid diverticulosis without definite evidence of diverticulitis. RIGHT renal cyst. Coronary artery disease. COPD changes at lung bases with suspected mild pulmonary fibrosis. Aortic Atherosclerosis (ICD10-I70.0) and Emphysema (ICD10-J43.9). Electronically Signed   By: Lavonia Dana M.D.   On: 08/08/2017 16:13     Management plans discussed with the patient, family and they are in agreement.  CODE STATUS: No Order   TOTAL TIME TAKING CARE OF THIS PATIENT: 20 minutes.    Demetrios Loll M.D on 08/08/2017 at 6:31 PM  Between 7am to 6pm - Pager - [phone]  After 6pm go to [email or web address] - Proofreader  [facility]  Office  [phone]  CC: Primary care physician; Rusty Aus, MD   Note: This dictation was prepared with Dragon dictation along with smaller phrase technology. Any transcriptional errors that result from this process are unintentional.

## 2017-08-08 NOTE — ED Notes (Signed)
Iv meds infusing.  Pt waiitng for admission.  Pt alert  afib on monitor.

## 2017-08-08 NOTE — ED Provider Notes (Signed)
[facility] Emergency Department Provider Note    First MD Initiated Contact with Patient 08/08/17 1420     (approximate)  I have reviewed the triage vital signs and the nursing notes.   HISTORY  Chief Complaint Abdominal Pain and Altered Mental Status    HPI [name] [name] is a 83 y.o. male chief complaint of 1 week of worsening abdominal distention decreased p.o. intake, increased confusion decreased appetite.  Denies any worsening shortness of breath or chest pain.  Does feel more distended and is not passing gas.  Does not member last bowel movement.  He is on Coumadin for history of A. fib and has been taking his medications.  Denies any falls.  Does wear 4 L nasal cannula for history of COPD and CHF.  Past Medical History:  Diagnosis Date  . Atrial fibrillation ([place])   . Basal cell carcinoma   . CHF (congestive heart failure) ([place])   . Chronic kidney disease   . COPD (chronic obstructive pulmonary disease) ([place])   . Coronary artery disease   . Gout   . Hyperlipidemia   . Hypertension   . Myocardial infarction ([place])   . Pulmonary fibrosis ([place])   . Pulmonary HTN ([place])    Family History  Problem Relation Age of Onset  . Heart attack Father   . Heart attack Brother    Past Surgical History:  Procedure Laterality Date  . CARDIAC CATHETERIZATION    . CARDIOVERSION    . CAROTID ENDARTERECTOMY Left   . HERNIA REPAIR    . REPLACEMENT TOTAL KNEE Left   . TOTAL HIP ARTHROPLASTY Right    Patient Active Problem List   Diagnosis Date Noted  . Atrial fibrillation with RVR ([place]) 08/08/2017  . Chronic diastolic heart failure ([place]) 01/31/2015  . Atrial fibrillation ([place]) 01/31/2015  . Pulmonary HTN ([place]) 01/31/2015      Prior to Admission medications   Not on File    Allergies Patient has no known allergies.    Social History Social History   Tobacco Use  . Smoking status: Former Smoker    Packs/day: 0.00    Years: 20.00   Pack years: 0.00    Last attempt to quit: 01/31/1971    Years since quitting: 46.5  . Smokeless tobacco: Never Used  . Tobacco comment: quit 4 yrs ago  Substance Use Topics  . Alcohol use: Yes    Alcohol/week: 0.0 oz    Comment: occ  . Drug use: No    Review of Systems Patient denies headaches, rhinorrhea, blurry vision, numbness, shortness of breath, chest pain, edema, cough, abdominal pain, nausea, vomiting, diarrhea, dysuria, fevers, rashes or hallucinations unless otherwise stated above in HPI. ____________________________________________   PHYSICAL EXAM:  VITAL SIGNS: Vitals:   08/08/17 2000 08/08/17 2100  BP: (!) 146/91 (!) 146/80  Pulse:    Resp: 20 (!) 22  Temp:    SpO2:      Constitutional: Alert and oriented.  Ill appearing  Eyes: Conjunctivae are normal.  Head: Atraumatic. Nose: No congestion/rhinnorhea. Mouth/Throat: Mucous membranes are moist.   Neck: No stridor. Painless ROM.  Cardiovascular: tachycardic, irregularly irregular rhythm. Grossly normal heart sounds.  Good peripheral circulation. Respiratory: Normal respiratory effort.  No retractions. Mild tachypnea with diminished BS posteriorly Gastrointestinal: + distention. Tympanic to percussion, hypoactive bowel sounds No abdominal bruits. No CVA tenderness. Genitourinary:  Musculoskeletal: No lower extremity tenderness nor edema.  No joint effusions. Neurologic:  Normal speech and language. No  gross focal neurologic deficits are appreciated. No facial droop Skin:  Skin is warm, dry and intact. No rash noted. Psychiatric: Mood and affect are normal. Speech and behavior are normal.  ____________________________________________   LABS (all labs ordered are listed, but only abnormal results are displayed)  Results for orders placed or performed during the hospital encounter of 08/08/17 (from the past 24 hour(s))  Lipase, blood     Status: None   Collection Time: 08/08/17  2:22 PM  Result Value Ref  Range   Lipase 18 11 - 51 U/L  Comprehensive metabolic panel     Status: Abnormal   Collection Time: 08/08/17  2:22 PM  Result Value Ref Range   Sodium 135 135 - 145 mmol/L   Potassium 3.7 3.5 - 5.1 mmol/L   Chloride 95 (L) 101 - 111 mmol/L   CO2 23 22 - 32 mmol/L   Glucose, Bld 213 (H) 65 - 99 mg/dL   BUN 86 (H) 6 - 20 mg/dL   Creatinine, Ser 3.01 (H) 0.61 - 1.24 mg/dL   Calcium 9.6 8.9 - 10.3 mg/dL   Total Protein 7.6 6.5 - 8.1 g/dL   Albumin 3.9 3.5 - 5.0 g/dL   AST 35 15 - 41 U/L   ALT 19 17 - 63 U/L   Alkaline Phosphatase 153 (H) 38 - 126 U/L   Total Bilirubin 1.1 0.3 - 1.2 mg/dL   GFR calc non Af Amer 17 (L) >60 mL/min   GFR calc Af Amer 20 (L) >60 mL/min   Anion gap 17 (H) 5 - 15  CBC     Status: Abnormal   Collection Time: 08/08/17  2:22 PM  Result Value Ref Range   WBC 5.9 3.8 - 10.6 K/uL   RBC 3.79 (L) 4.40 - 5.90 MIL/uL   Hemoglobin 13.1 13.0 - 18.0 g/dL   HCT 40.1 40.0 - 52.0 %   MCV 105.8 (H) 80.0 - 100.0 fL   MCH 34.5 (H) 26.0 - 34.0 pg   MCHC 32.6 32.0 - 36.0 g/dL   RDW 15.7 (H) 11.5 - 14.5 %   Platelets 250 150 - 440 K/uL  Protime-INR     Status: Abnormal   Collection Time: 08/08/17  2:22 PM  Result Value Ref Range   Prothrombin Time 41.7 (H) 11.4 - 15.2 seconds   INR 4.40 (HH)   Troponin I     Status: Abnormal   Collection Time: 08/08/17  2:22 PM  Result Value Ref Range   Troponin I 0.05 (HH) <0.03 ng/mL   ____________________________________________  EKG My review and personal interpretation at Time: 14:16   Indication: tachycardia  Rate: 130  Rhythm: afib Axis: left Other: ivcd, no stemi, otherwise normal intervals, ____________________________________________  RADIOLOGY  I personally reviewed all radiographic images ordered to evaluate for the above acute complaints and reviewed radiology reports and findings.  These findings were personally discussed with the patient.  Please see medical record for radiology  report.  ____________________________________________   PROCEDURES  Procedure(s) performed:  Procedures    Critical Care performed: yes CRITICAL CARE Performed by: Merlyn Lot   Total critical care time: 45 minutes  Critical care time was exclusive of separately billable procedures and treating other patients.  Critical care was necessary to treat or prevent imminent or life-threatening deterioration.  Critical care was time spent personally by me on the following activities: development of treatment plan with patient and/or surrogate as well as nursing, discussions with consultants, evaluation of patient's response to  treatment, examination of patient, obtaining history from patient or surrogate, ordering and performing treatments and interventions, ordering and review of laboratory studies, ordering and review of radiographic studies, pulse oximetry and re-evaluation of patient's condition.  ____________________________________________   INITIAL IMPRESSION / ASSESSMENT AND PLAN / ED COURSE  Pertinent labs & imaging results that were available during my care of the patient were reviewed by me and considered in my medical decision making (see chart for details).  DDX: dysrhythmia, dehydration, sbo, volvulus, diverticulosis, mass  SKIP LITKE is a 83 y.o. who presents to the ED with with anorexia nausea decreased oral intake and abdominal distention as described above.  CT imaging blood work ordered for the above differential shows evidence of acute kidney injury, supratherapeutic INR.  Hemoglobin appears stable.  CT imaging shows evidence of colonic obstruction.  Patient also noted to be in A. fib with RVR.  Will start on Cardizem drip.  Clinical Course as of Aug 08 2113  Mon Aug 08, 2017  1639 I spoke with Dr. Burt Knack who has recommended evaluation her for urgent colonoscopy for stenting.  I spoke with Dr. Lenon Ahmadi of gastroenterology who agrees to consult on  patient evaluate for colonoscopic intervention.  Based on his AK I, supratherapeutic INR, A. fib with RVR and acute on normality on his CT scan do feel patient will be admitted to hospitalist service for further evaluation and management.  Patient and family updated at bedside.  [PR]  1809 GI has evaluated patient at bedside and is requested imaging with rectal contrast to evaluate for obstruction or mass.  [PR]  1836 Patient was evaluated by anesthesia he did not feel comfortable sedating this patient for colonoscopy which she needs urgently.  Family is requesting transfer [facility].  [PR]  1907 After lengthy process patient has been fortunately accepted at [facility] ICU for further hemodynamic monitoring and resuscitation.  [PR]    Clinical Course User Index [PR] Merlyn Lot, MD     ____________________________________________   FINAL CLINICAL IMPRESSION(S) / ED DIAGNOSES  Final diagnoses:  AKI (acute kidney injury) ([place])  Colonic obstruction ([place])  Supratherapeutic INR  Atrial fibrillation with RVR ([place])      NEW MEDICATIONS STARTED DURING THIS VISIT:  This SmartLink is deprecated. Use AVSMEDLIST instead to display the medication list for a patient.   Note:  This document was prepared using Dragon voice recognition software and may include unintentional dictation errors.    Merlyn Lot, MD 08/08/17 2114

## 2017-08-08 NOTE — ED Notes (Signed)
Pt unable to void at this time  md aware.

## 2017-09-03 DEATH — deceased

## 2018-03-15 IMAGING — MR MR SHOULDER*R* W/O CM
5 series · 40 of 40 positions shown · non-contrast
Comparison: Plain films right shoulder 05/15/2016

CLINICAL DATA: Chronic right shoulder pain and limited range of
motion. Remote history of fall.

EXAM:
MRI OF THE RIGHT SHOULDER WITHOUT CONTRAST
TECHNIQUE: Multiplanar, multisequence MR imaging of the shoulder was performed.
No intravenous contrast was administered.

[Series 3: T2 fat-sat · axial · 4.0mm · 0.47mm/px · z∈[-33,+73]mm · 8 of 25 slices shown (1 of 3)]
[im 1/25]
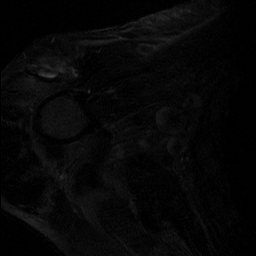
[im 4/25]
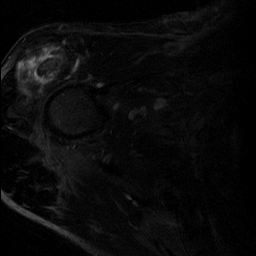
[im 7/25]
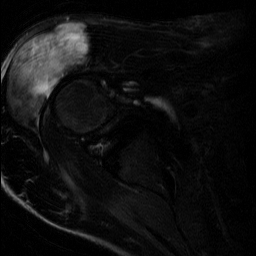
[im 11/25]
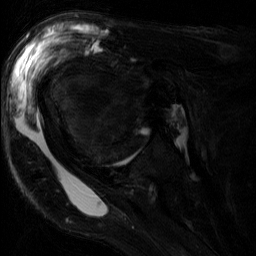
[im 14/25]
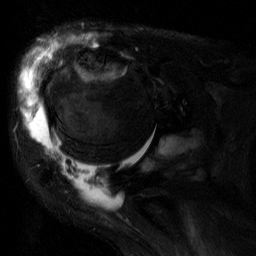
[im 18/25]
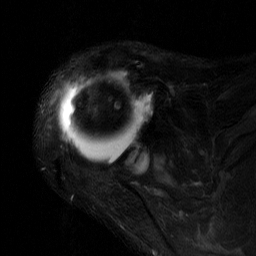
[im 21/25]
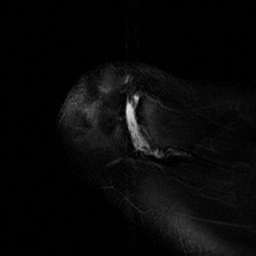
[im 25/25]
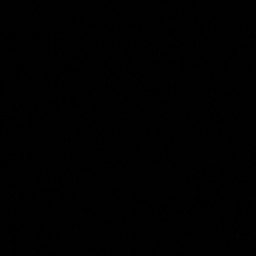

[Series 4: T2 fat-sat · oblique · 4.0mm · 0.62mm/px · 7 of 23 slices shown (2 of 3)]
[im 1/23]
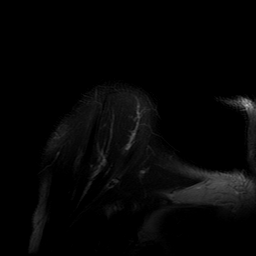
[im 4/23]
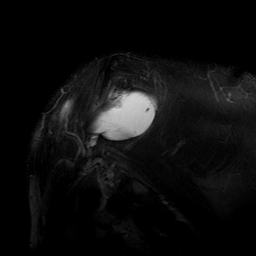
[im 8/23]
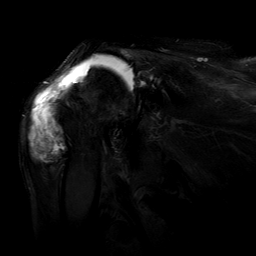
[im 12/23]
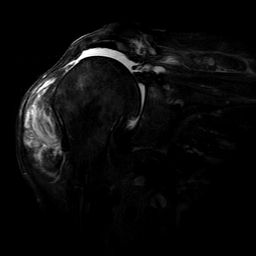
[im 15/23]
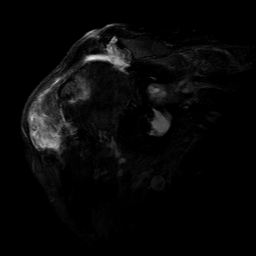
[im 19/23]
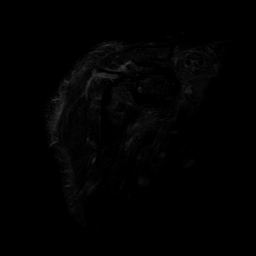
[im 23/23]
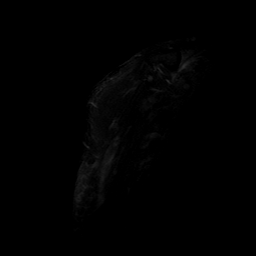

[Series 5: PD · oblique · 4.0mm · 0.62mm/px · 7 of 23 slices shown]
[im 1/23]
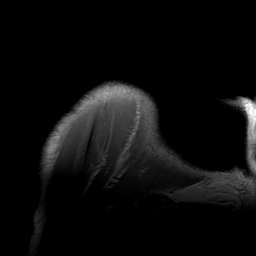
[im 4/23]
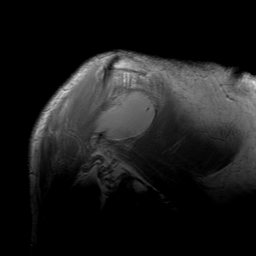
[im 8/23]
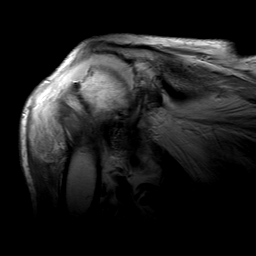
[im 12/23]
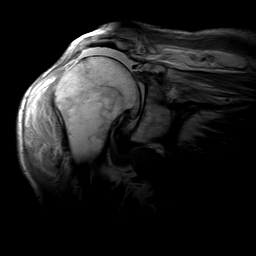
[im 15/23]
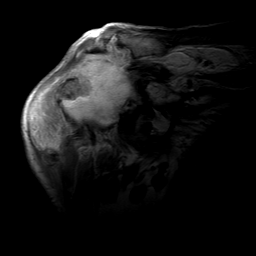
[im 19/23]
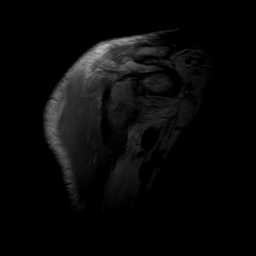
[im 23/23]
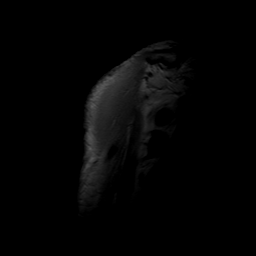

[Series 6: T1 · oblique · 4.0mm · 0.62mm/px · 9 of 29 slices shown]
[im 1/29]
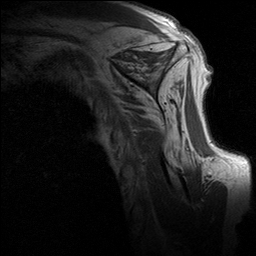
[im 4/29]
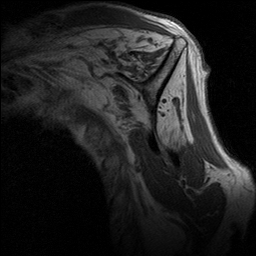
[im 8/29]
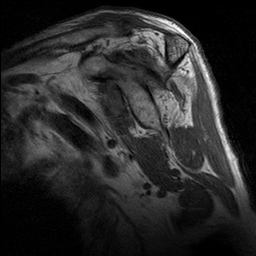
[im 11/29]
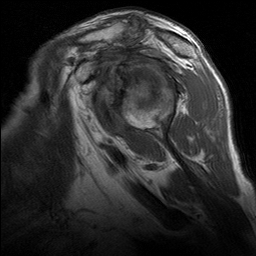
[im 15/29]
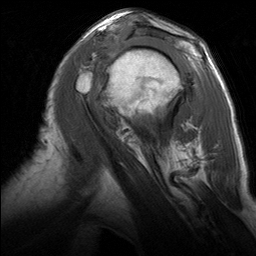
[im 18/29]
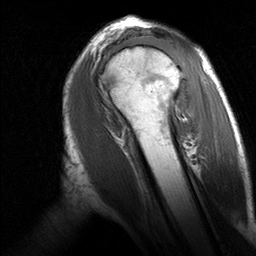
[im 22/29]
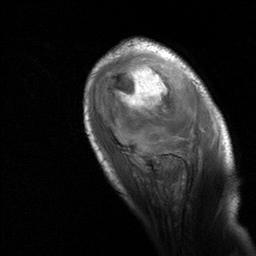
[im 25/29]
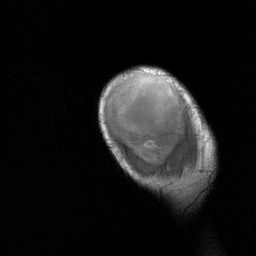
[im 29/29]
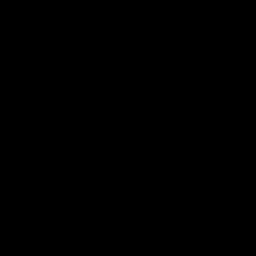

[Series 7: T2 fat-sat · oblique · 4.0mm · 0.62mm/px · 9 of 29 slices shown (3 of 3)]
[im 1/29]
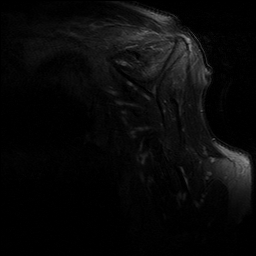
[im 4/29]
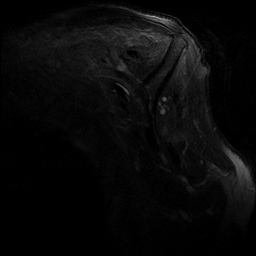
[im 8/29]
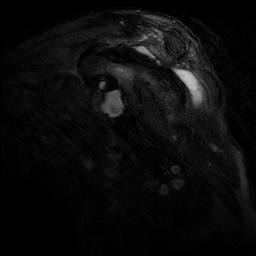
[im 11/29]
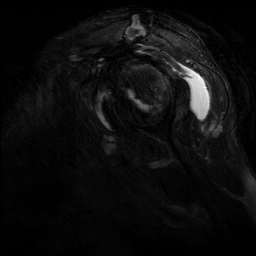
[im 15/29]
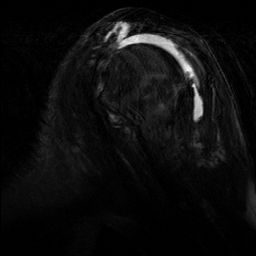
[im 18/29]
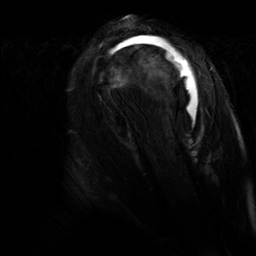
[im 22/29]
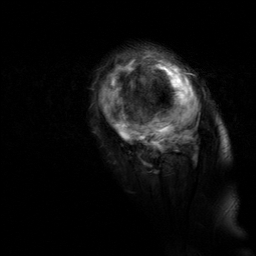
[im 25/29]
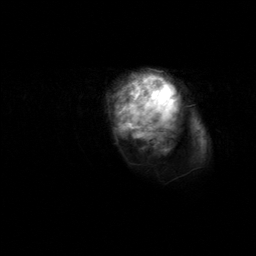
[im 29/29]
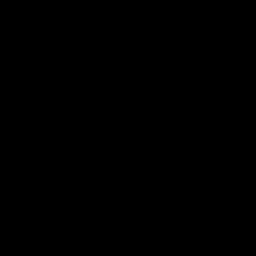

[40 of 40 positions shown; findings below may reference images not displayed]

FINDINGS: Rotator cuff: The patient has complete supraspinatus and
infraspinatus tendon tears with retraction to the level of the
glenoid, 5-6 cm. The subscapularis is also completely torn and
retracted to the glenoid, 4-5 cm. The infraspinatus is intact.

Muscles: There is fatty replacement of the rotator cuff musculature,
worst in the infraspinatus.

Biceps long head: The tendon is completely torn from the superior
labrum.

Acromioclavicular Joint: Bulky degenerative change is present. Type
2 acromion. The acromion is remodeled and sclerotic due to abutment
with the humeral head. There is a massive volume of fluid and debris
in the subacromial/subdeltoid bursa. Some of the debris within the
bursa appears encapsulated in a collection measuring 6.0 cm AP x
cm transverse by 6.0 cm craniocaudal.

Glenohumeral Joint: Advanced degenerative change is present with a
large osteophyte off the humeral head. Denuding of hyaline cartilage
and extensive subchondral cyst cyst formation are seen in the
glenoid. The humeral head is high-riding.

Labrum:  Diffusely degenerated.

Bones: No acute fracture or worrisome lesion. Defect in the
posterior, lateral margin the humeral head is most compatible with
remote Hill-Sachs lesion.

Other: None.
IMPRESSION: Complete supraspinatus, infraspinatus and subscapularis tendon tears
with retraction and atrophy as described above.

Severe subacromial/subdeltoid bursitis with a large encapsulated
appearing collection of debris within the bursa.

Advanced acromioclavicular and glenohumeral osteoarthritis.

Remote Hill-Sachs lesion consistent with prior dislocation.
